# Patient Record
Sex: Female | Born: 1960 | Race: Black or African American | Hispanic: No | Marital: Married | Smoking: Never smoker
Health system: Southern US, Community
[De-identification: ages and names within clinical notes are randomized; demographics above are authoritative.]

## PROBLEM LIST (undated history)

## (undated) DIAGNOSIS — R519 Headache, unspecified: Secondary | ICD-10-CM

## (undated) DIAGNOSIS — M1990 Unspecified osteoarthritis, unspecified site: Secondary | ICD-10-CM

## (undated) DIAGNOSIS — D649 Anemia, unspecified: Secondary | ICD-10-CM

## (undated) HISTORY — PX: CHOLECYSTECTOMY: SHX55

## (undated) HISTORY — PX: BREAST BIOPSY: SHX20

---

## 2016-09-19 ENCOUNTER — Emergency Department (HOSPITAL_COMMUNITY)
Admission: EM | Admit: 2016-09-19 | Discharge: 2016-09-19 | Disposition: A | Discharge status: Home / Self Care | Payer: BLUE CROSS/BLUE SHIELD | Attending: Emergency Medicine | Admitting: Emergency Medicine

## 2016-09-19 ENCOUNTER — Encounter (HOSPITAL_COMMUNITY): Payer: Self-pay | Admitting: Emergency Medicine

## 2016-09-19 ENCOUNTER — Emergency Department (HOSPITAL_COMMUNITY): Payer: BLUE CROSS/BLUE SHIELD

## 2016-09-19 DIAGNOSIS — S99911A Unspecified injury of right ankle, initial encounter: Secondary | ICD-10-CM | POA: Diagnosis present

## 2016-09-19 DIAGNOSIS — W010XXA Fall on same level from slipping, tripping and stumbling without subsequent striking against object, initial encounter: Secondary | ICD-10-CM | POA: Diagnosis not present

## 2016-09-19 DIAGNOSIS — Y929 Unspecified place or not applicable: Secondary | ICD-10-CM | POA: Diagnosis not present

## 2016-09-19 DIAGNOSIS — S93401A Sprain of unspecified ligament of right ankle, initial encounter: Secondary | ICD-10-CM

## 2016-09-19 DIAGNOSIS — Y999 Unspecified external cause status: Secondary | ICD-10-CM | POA: Insufficient documentation

## 2016-09-19 DIAGNOSIS — Y939 Activity, unspecified: Secondary | ICD-10-CM | POA: Diagnosis not present

## 2016-09-19 MED ORDER — IBUPROFEN 200 MG PO TABS
600.0000 mg | ORAL_TABLET | Freq: Once | ORAL | Status: AC
  Administered 2016-09-19: 600 mg via ORAL
  Filled 2016-09-19: qty 3

## 2016-09-19 MED ORDER — IBUPROFEN 600 MG PO TABS: 600.0000 mg | ORAL_TABLET | Freq: Four times a day (QID) | ORAL | 0 refills | Status: AC | PRN

## 2016-09-19 NOTE — Discharge Instructions (Signed)
Please read and follow all provided instructions.  Your diagnoses today include:  1. Sprain of right ankle, unspecified ligament, initial encounter     Tests performed today include: Vital signs. See below for your results today.   Medications prescribed:  Take as prescribed   Home care instructions:  Follow any educational materials contained in this packet.  Follow-up instructions: Please follow-up with your primary care provider for further evaluation of symptoms and treatment   Return instructions:  Please return to the Emergency Department if you do not get better, if you get worse, or new symptoms OR  - Fever (temperature greater than 101.80F)  - Bleeding that does not stop with holding pressure to the area    -Severe pain (please note that you may be more sore the day after your accident)  - Chest Pain  - Difficulty breathing  - Severe nausea or vomiting  - Inability to tolerate food and liquids  - Passing out  - Skin becoming red around your wounds  - Change in mental status (confusion or lethargy)  - New numbness or weakness    Please return if you have any other emergent concerns.  Additional Information:  Your vital signs today were: BP (!) 151/73 (BP Location: Right Arm)    Pulse 74    Temp 98.2 F (36.8 C) (Oral)    Resp 18    Ht 5' 5.5" (1.664 m)    Wt 90.7 kg (200 lb)    SpO2 100%    BMI 32.78 kg/m  If your blood pressure (BP) was elevated above 135/85 this visit, please have this repeated by your doctor within one month. ---------------

## 2016-09-19 NOTE — ED Triage Notes (Signed)
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

## 2016-09-19 NOTE — ED Provider Notes (Signed)
[facility] DEPT Provider Note   CSN: [id number] Arrival date & time: 09/19/16  1900     History   Chief Complaint Chief Complaint  Patient presents with  . Fall  . Ankle Pain    HPI Lauren Villarreal is a 56 y.o. female.  HPI  56 y.o. female, presents to the Emergency Department today due to right ankle pain. Occurred this morning after buffing floors and slipping and falling. Notes pain with ambulation. Rates pain 5/10. Worse with ROM. Minimal at rest. No meds PTA. No numbness/tingling. No other symptoms noted.    History reviewed. No pertinent past medical history.  There are no active problems to display for this patient.   Past Surgical History:  Procedure Laterality Date  . CHOLECYSTECTOMY      OB History    No data available       Home Medications    Prior to Admission medications   Not on File    Family History Family History  Problem Relation Age of Onset  . Diabetes Other     Social History Social History  Substance Use Topics  . Smoking status: Never Smoker  . Smokeless tobacco: Never Used  . Alcohol use No     Allergies   Patient has no known allergies.   Review of Systems Review of Systems ROS reviewed and all are negative for acute change except as noted in the HPI.  Physical Exam Updated Vital Signs BP (!) 151/73 (BP Location: Right Arm)   Pulse 74   Temp 98.2 F (36.8 C) (Oral)   Resp 18   Ht 5' 5.5" (1.664 m)   Wt 90.7 kg (200 lb)   SpO2 100%   BMI 32.78 kg/m   Physical Exam  Constitutional: She is oriented to person, place, and time. Vital signs are normal. She appears well-developed and well-nourished.  HENT:  Head: Normocephalic and atraumatic.  Right Ear: Hearing normal.  Left Ear: Hearing normal.  Eyes: Pupils are equal, round, and reactive to light. Conjunctivae and EOM are normal.  Neck: Normal range of motion. Neck supple.  Cardiovascular: Normal rate and regular rhythm.   Pulmonary/Chest:  Effort normal.  Musculoskeletal: Normal range of motion.  Right ankle  Pain below lateral malleolus. NVI. Motor/sensation intact. ROM intact. Pain with inversion.   Neurological: She is alert and oriented to person, place, and time.  Skin: Skin is warm and dry.  Psychiatric: She has a normal mood and affect. Her speech is normal and behavior is normal. Thought content normal.  Nursing note and vitals reviewed.    ED Treatments / Results  Labs (all labs ordered are listed, but only abnormal results are displayed) Labs Reviewed - No data to display  EKG  EKG Interpretation None       Radiology Dg Ankle Complete Right  Result Date: 09/19/2016 CLINICAL DATA:  Pain after fall EXAM: RIGHT ANKLE - COMPLETE 3+ VIEW COMPARISON:  None. FINDINGS: Soft tissue swelling identified, particularly medially. Mild irregularity at the distal tip of the medial malleolus is age indeterminate but likely represents sequela of an avulsion injury. No other evidence of fracture. The ankle mortise is intact. No other acute abnormalities. IMPRESSION: Mild irregularity at the distal tip of the medial malleolus is consistent with an age indeterminate avulsion injury. No other abnormalities. Electronically Signed   By: Dorise Bullion III M.D   On: 09/19/2016 19:52    Procedures Procedures (including critical care time)  Medications Ordered in ED Medications  ibuprofen (  ADVIL,MOTRIN) tablet 600 mg (not administered)     Initial Impression / Assessment and Plan / ED Course  I have reviewed the triage vital signs and the nursing notes.  Pertinent labs & imaging results that were available during my care of the patient were reviewed by me and considered in my medical decision making (see chart for details).  Final Clinical Impressions(s) / ED Diagnoses   {I have reviewed and evaluated the relevant imaging studies.  {I have reviewed the relevant previous healthcare records.  {I obtained HPI from historian.    ED Course:  Assessment: Patient X-Ray negative for obvious fracture or dislocation. Noted age indeterminate avulsion fracture of medial malleolus. Pt advised to follow up with PCP. Patient given aso brace and cructhes while in ED, conservative therapy recommended and discussed. Patient will be discharged home & is agreeable with above plan. Returns precautions discussed. Pt appears safe for discharge.  Disposition/Plan:  DC Home Additional Verbal discharge instructions given and discussed with patient.  Pt Instructed to f/u with PCP in the next week for evaluation and treatment of symptoms. Return precautions given Pt acknowledges and agrees with plan  Supervising Physician Fredia Sorrow, MD  Final diagnoses:  Sprain of right ankle, unspecified ligament, initial encounter    New Prescriptions New Prescriptions   No medications on file     Conni Slipper 09/19/16 2128    Fredia Sorrow, MD 09/19/16 2207

## 2016-09-19 NOTE — ED Notes (Signed)
Pt ambulatory and independent at discharge.  Verbalized understanding of discharge instructions 

## 2016-09-19 NOTE — ED Notes (Signed)
Ortho at bedside.

## 2017-02-08 ENCOUNTER — Encounter (HOSPITAL_COMMUNITY): Payer: Self-pay | Admitting: *Deleted

## 2017-02-08 ENCOUNTER — Ambulatory Visit (HOSPITAL_COMMUNITY)
Admission: EM | Admit: 2017-02-08 | Discharge: 2017-02-08 | Disposition: A | Discharge status: Home / Self Care | Payer: BLUE CROSS/BLUE SHIELD | Attending: Family Medicine | Admitting: Family Medicine

## 2017-02-08 DIAGNOSIS — J019 Acute sinusitis, unspecified: Secondary | ICD-10-CM | POA: Diagnosis not present

## 2017-02-08 MED ORDER — AMOXICILLIN-POT CLAVULANATE 875-125 MG PO TABS: 1.0000 | ORAL_TABLET | Freq: Two times a day (BID) | ORAL | 0 refills | Status: AC

## 2017-02-08 MED ORDER — FLUTICASONE PROPIONATE 50 MCG/ACT NA SUSP: 1.0000 | Freq: Every day | NASAL | 2 refills | Status: AC

## 2017-02-08 NOTE — Discharge Instructions (Signed)
Push fluids to ensure adequate hydration and keep secretions thin.  Tylenol and/or ibuprofen as needed for pain or fevers.  Daily flonase. Complete course of antibiotics. May use over the counter mucinex or mucinex d as needed for symptoms as well. If symptoms worsen or do not improve in the next week to return to be seen or to follow up with your PCP.

## 2017-02-08 NOTE — ED Triage Notes (Signed)
Per pt she is having nasal congestion, head pressure,

## 2017-02-08 NOTE — ED Provider Notes (Signed)
[facility]    CSN: [id number] Arrival date & time: 02/08/17  1242     History   Chief Complaint Chief Complaint  Patient presents with  . Headache  . Facial Pain    HPI Lauren Villarreal is a 57 y.o. female.   Lauren Villarreal presents with complaints of worsening congestion, facial pressure and mild cough. Symptoms started in mid December, saw her PCP who told her it was viral. She took otc medications which mildly helped and her symptoms had somewhat improved. They never resolved however. This morning woke up clammy with increased congestion, post nasal drip, discomfort 6/10. Without sore throat, ear pain, gi/gu complaints. No known ill contacts. She is a Pharmacist, hospital. Occasional mucus production with cough. Without medical history, does not take any medications daily.   ROS per HPI.       History reviewed. No pertinent past medical history.  There are no active problems to display for this patient.   Past Surgical History:  Procedure Laterality Date  . CHOLECYSTECTOMY      OB History    No data available       Home Medications    Prior to Admission medications   Medication Sig Start Date End Date Taking? Authorizing Provider  amoxicillin-clavulanate (AUGMENTIN) 875-125 MG tablet Take 1 tablet by mouth every 12 (twelve) hours for 10 days. 02/08/17 02/18/17  Zigmund Gottron, NP  fluticasone (FLONASE) 50 MCG/ACT nasal spray Place 1 spray into both nostrils daily. 02/08/17   Zigmund Gottron, NP  ibuprofen (ADVIL,MOTRIN) 600 MG tablet Take 1 tablet (600 mg total) by mouth every 6 (six) hours as needed. 09/19/16   Shary Decamp, PA-C    Family History Family History  Problem Relation Age of Onset  . Diabetes Other     Social History Social History   Tobacco Use  . Smoking status: Never Smoker  . Smokeless tobacco: Never Used  Substance Use Topics  . Alcohol use: No  . Drug use: No     Allergies   Patient has no known allergies.   Review of  Systems Review of Systems   Physical Exam Triage Vital Signs ED Triage Vitals  Enc Vitals Group     BP 02/08/17 1429 139/83     Pulse Rate 02/08/17 1429 86     Resp --      Temp 02/08/17 1429 98.1 F (36.7 C)     Temp src --      SpO2 02/08/17 1429 98 %     Weight --      Height --      Head Circumference --      Peak Flow --      Pain Score 02/08/17 1428 6     Pain Loc --      Pain Edu? --      Excl. in [place]? --    No data found.  Updated Vital Signs BP 139/83 (BP Location: Left Arm)   Pulse 86   Temp 98.1 F (36.7 C)   SpO2 98%   Visual Acuity Right Eye Distance:   Left Eye Distance:   Bilateral Distance:    Right Eye Near:   Left Eye Near:    Bilateral Near:     Physical Exam  Constitutional: She is oriented to person, place, and time. She appears well-developed and well-nourished. No distress.  HENT:  Head: Normocephalic and atraumatic.  Right Ear: Tympanic membrane, external ear and ear canal normal.  Left Ear: Tympanic  membrane, external ear and ear canal normal.  Nose: Mucosal edema and rhinorrhea present.  Mouth/Throat: Uvula is midline, oropharynx is clear and moist and mucous membranes are normal. No tonsillar exudate.  Eyes: Conjunctivae and EOM are normal. Pupils are equal, round, and reactive to light.  Cardiovascular: Normal rate, regular rhythm and normal heart sounds.  Pulmonary/Chest: Effort normal and breath sounds normal.  Neurological: She is alert and oriented to person, place, and time.  Skin: Skin is warm and dry.     UC Treatments / Results  Labs (all labs ordered are listed, but only abnormal results are displayed) Labs Reviewed - No data to display  EKG  EKG Interpretation None       Radiology No results found.  Procedures Procedures (including critical care time)  Medications Ordered in UC Medications - No data to display   Initial Impression / Assessment and Plan / UC Course  I have reviewed the triage vital  signs and the nursing notes.  Pertinent labs & imaging results that were available during my care of the patient were reviewed by me and considered in my medical decision making (see chart for details).     Benign physical findings, non toxic in appearance. Symptoms have been persistent for approximately 1 month and have worsened. Will treat with antibiotics at this time. Daily flonase. otc treatments as needed for symptoms. Discussed with patient that this may also be a new viral illness, as without fevers. If symptoms worsen or do not improve in the next week to return to be seen or to follow up with PCP.  Patient verbalized understanding and agreeable to plan.    Final Clinical Impressions(s) / UC Diagnoses   Final diagnoses:  Acute sinusitis, recurrence not specified, unspecified location    ED Discharge Orders        Ordered    fluticasone (FLONASE) 50 MCG/ACT nasal spray  Daily     02/08/17 1502    amoxicillin-clavulanate (AUGMENTIN) 875-125 MG tablet  Every 12 hours     02/08/17 1502       Controlled Substance Prescriptions [place] Controlled Substance Registry consulted? Not Applicable   Zigmund Gottron, NP 02/08/17 1506

## 2017-02-28 ENCOUNTER — Ambulatory Visit
Admission: RE | Admit: 2017-02-28 | Discharge: 2017-02-28 | Disposition: A | Discharge status: Home / Self Care | Payer: BLUE CROSS/BLUE SHIELD | Source: Ambulatory Visit | Attending: Family Medicine | Admitting: Family Medicine

## 2017-02-28 ENCOUNTER — Other Ambulatory Visit: Payer: Self-pay | Admitting: Family Medicine

## 2017-02-28 DIAGNOSIS — R509 Fever, unspecified: Secondary | ICD-10-CM

## 2019-03-22 ENCOUNTER — Other Ambulatory Visit: Payer: Self-pay | Admitting: Family Medicine

## 2019-03-22 DIAGNOSIS — Z1231 Encounter for screening mammogram for malignant neoplasm of breast: Secondary | ICD-10-CM

## 2019-06-22 ENCOUNTER — Encounter (INDEPENDENT_AMBULATORY_CARE_PROVIDER_SITE_OTHER): Payer: Self-pay | Admitting: Otolaryngology

## 2019-06-22 ENCOUNTER — Ambulatory Visit (INDEPENDENT_AMBULATORY_CARE_PROVIDER_SITE_OTHER): Payer: BC Managed Care – PPO | Admitting: Otolaryngology

## 2019-06-22 ENCOUNTER — Other Ambulatory Visit: Payer: Self-pay

## 2019-06-22 VITALS — Temp 97.5°F

## 2019-06-22 DIAGNOSIS — H6122 Impacted cerumen, left ear: Secondary | ICD-10-CM | POA: Diagnosis not present

## 2019-06-22 NOTE — Progress Notes (Signed)
HPI: Lauren Villarreal is a 59 y.o. female who presents for evaluation of blockage of her left ear.  She has had problems with earwax in the past and is used Q-tips but this caused problems.  More recently tried to clean the ear with a Bobby pin.  She still has blockage of the left ear.  No past medical history on file. Past Surgical History:  Procedure Laterality Date   CHOLECYSTECTOMY     Social History   Socioeconomic History   Marital status: Married    Spouse name: Not on file   Number of children: Not on file   Years of education: Not on file   Highest education level: Not on file  Occupational History   Not on file  Tobacco Use   Smoking status: Never Smoker   Smokeless tobacco: Never Used  Substance and Sexual Activity   Alcohol use: No   Drug use: No   Sexual activity: Not on file  Other Topics Concern   Not on file  Social History Narrative   Not on file   Social Determinants of Health   Financial Resource Strain:    Difficulty of Paying Living Expenses:   Food Insecurity:    Worried About [place] in the Last Year:    Arboriculturist in the Last Year:   Transportation Needs:    Film/video editor (Medical):    Lack of Transportation (Non-Medical):   Physical Activity:    Days of Exercise per Week:    Minutes of Exercise per Session:   Stress:    Feeling of Stress :   Social Connections:    Frequency of Communication with Friends and Family:    Frequency of Social Gatherings with Friends and Family:    Attends Religious Services:    Active Member of Clubs or Organizations:    Attends Music therapist:    Marital Status:    Family History  Problem Relation Age of Onset   Diabetes Other    No Known Allergies Prior to Admission medications   Medication Sig Start Date End Date Taking? Authorizing Provider  fluticasone (FLONASE) 50 MCG/ACT nasal spray Place 1 spray into both nostrils daily.  02/08/17  Yes Burky, Lanelle Bal B, NP  ibuprofen (ADVIL,MOTRIN) 600 MG tablet Take 1 tablet (600 mg total) by mouth every 6 (six) hours as needed. 09/19/16  Yes Shary Decamp, PA-C     Positive ROS: Otherwise negative  All other systems have been reviewed and were otherwise negative with the exception of those mentioned in the HPI and as above.  Physical Exam: Constitutional: Alert, well-appearing, no acute distress Ears: External ears without lesions or tenderness.  Right ear canal and TM are clear.  Left ear canal is completely occluded with cerumen adjacent to the left TM.  This was cleaned with suction and hydrogen peroxide.  The TM was clear. Nasal: External nose without lesions.. Clear nasal passages Oral: Lips and gums without lesions. Tongue and palate mucosa without lesions. Posterior oropharynx clear. Neck: No palpable adenopathy or masses Respiratory: Breathing comfortably  Skin: No facial/neck lesions or rash noted.  Cerumen impaction removal  Date/Time: 06/22/2019 4:00 PM Performed by: Rozetta Nunnery, MD Authorized by: Rozetta Nunnery, MD   Consent:    Consent obtained:  Verbal   Consent given by:  Patient   Risks discussed:  Pain and bleeding Procedure details:    Location:  L ear   Procedure type: suction  Post-procedure details:    Inspection:  TM intact and canal normal   Hearing quality:  Improved   Patient tolerance of procedure:  Tolerated well, no immediate complications Comments:     Left ear canal was occluded with cerumen that was cleaned with hydroperoxide and suction.  The left TM is clear.    Assessment: Left cerumen impaction  Plan: This was cleaned in the office with resolution of her symptoms. She will follow-up as needed  Radene Journey, MD

## 2019-07-23 ENCOUNTER — Other Ambulatory Visit: Payer: Self-pay | Admitting: Family Medicine

## 2019-07-23 DIAGNOSIS — N6489 Other specified disorders of breast: Secondary | ICD-10-CM

## 2019-07-27 ENCOUNTER — Other Ambulatory Visit: Payer: Self-pay | Admitting: Family Medicine

## 2019-07-27 DIAGNOSIS — N6489 Other specified disorders of breast: Secondary | ICD-10-CM

## 2019-08-16 ENCOUNTER — Other Ambulatory Visit: Payer: BC Managed Care – PPO

## 2019-08-23 ENCOUNTER — Other Ambulatory Visit: Payer: Self-pay

## 2019-08-23 ENCOUNTER — Ambulatory Visit: Payer: BC Managed Care – PPO

## 2019-08-23 ENCOUNTER — Ambulatory Visit
Admission: RE | Admit: 2019-08-23 | Discharge: 2019-08-23 | Disposition: A | Discharge status: Home / Self Care | Payer: BC Managed Care – PPO | Source: Ambulatory Visit | Attending: Family Medicine | Admitting: Family Medicine

## 2019-08-23 DIAGNOSIS — N6489 Other specified disorders of breast: Secondary | ICD-10-CM

## 2020-05-22 ENCOUNTER — Ambulatory Visit
Admission: RE | Admit: 2020-05-22 | Discharge: 2020-05-22 | Disposition: A | Discharge status: Home / Self Care | Payer: BC Managed Care – PPO | Source: Ambulatory Visit | Attending: Family Medicine | Admitting: Family Medicine

## 2020-05-22 ENCOUNTER — Other Ambulatory Visit: Payer: Self-pay | Admitting: Family Medicine

## 2020-05-22 DIAGNOSIS — M549 Dorsalgia, unspecified: Secondary | ICD-10-CM

## 2020-08-09 ENCOUNTER — Other Ambulatory Visit: Payer: Self-pay | Admitting: Family Medicine

## 2020-08-09 DIAGNOSIS — Z1231 Encounter for screening mammogram for malignant neoplasm of breast: Secondary | ICD-10-CM

## 2020-08-23 ENCOUNTER — Other Ambulatory Visit: Payer: Self-pay

## 2020-08-23 ENCOUNTER — Ambulatory Visit
Admission: RE | Admit: 2020-08-23 | Discharge: 2020-08-23 | Disposition: A | Discharge status: Home / Self Care | Payer: BC Managed Care – PPO | Source: Ambulatory Visit | Attending: Family Medicine | Admitting: Family Medicine

## 2020-08-23 DIAGNOSIS — Z1231 Encounter for screening mammogram for malignant neoplasm of breast: Secondary | ICD-10-CM

## 2020-08-28 ENCOUNTER — Other Ambulatory Visit: Payer: Self-pay | Admitting: Family Medicine

## 2020-08-28 DIAGNOSIS — R928 Other abnormal and inconclusive findings on diagnostic imaging of breast: Secondary | ICD-10-CM

## 2020-09-02 ENCOUNTER — Other Ambulatory Visit: Payer: BC Managed Care – PPO

## 2020-09-13 ENCOUNTER — Other Ambulatory Visit: Payer: Self-pay | Admitting: Family Medicine

## 2020-09-13 ENCOUNTER — Ambulatory Visit
Admission: RE | Admit: 2020-09-13 | Discharge: 2020-09-13 | Disposition: A | Discharge status: Home / Self Care | Payer: BC Managed Care – PPO | Source: Ambulatory Visit | Attending: Family Medicine | Admitting: Family Medicine

## 2020-09-13 ENCOUNTER — Other Ambulatory Visit: Payer: Self-pay

## 2020-09-13 DIAGNOSIS — R928 Other abnormal and inconclusive findings on diagnostic imaging of breast: Secondary | ICD-10-CM

## 2020-09-13 DIAGNOSIS — N6310 Unspecified lump in the right breast, unspecified quadrant: Secondary | ICD-10-CM

## 2020-09-21 ENCOUNTER — Other Ambulatory Visit: Payer: Self-pay

## 2020-09-21 ENCOUNTER — Ambulatory Visit
Admission: RE | Admit: 2020-09-21 | Discharge: 2020-09-21 | Disposition: A | Discharge status: Home / Self Care | Payer: BC Managed Care – PPO | Source: Ambulatory Visit | Attending: Family Medicine | Admitting: Family Medicine

## 2020-09-21 ENCOUNTER — Encounter (HOSPITAL_COMMUNITY): Payer: Self-pay

## 2020-09-21 DIAGNOSIS — N6310 Unspecified lump in the right breast, unspecified quadrant: Secondary | ICD-10-CM

## 2020-10-02 ENCOUNTER — Other Ambulatory Visit: Payer: Self-pay | Admitting: Surgery

## 2020-10-02 DIAGNOSIS — N63 Unspecified lump in unspecified breast: Secondary | ICD-10-CM

## 2020-10-14 ENCOUNTER — Other Ambulatory Visit: Payer: BC Managed Care – PPO

## 2020-10-30 ENCOUNTER — Other Ambulatory Visit: Payer: Self-pay

## 2020-10-30 ENCOUNTER — Ambulatory Visit
Admission: RE | Admit: 2020-10-30 | Discharge: 2020-10-30 | Disposition: A | Discharge status: Home / Self Care | Payer: BC Managed Care – PPO | Source: Ambulatory Visit | Attending: Surgery | Admitting: Surgery

## 2020-10-30 DIAGNOSIS — N63 Unspecified lump in unspecified breast: Secondary | ICD-10-CM

## 2020-10-30 MED ORDER — GADOBUTROL 1 MMOL/ML IV SOLN
10.0000 mL | Freq: Once | INTRAVENOUS | Status: AC | PRN
  Administered 2020-10-30: 10 mL via INTRAVENOUS

## 2020-11-06 ENCOUNTER — Other Ambulatory Visit: Payer: Self-pay | Admitting: Surgery

## 2020-11-06 DIAGNOSIS — R9389 Abnormal findings on diagnostic imaging of other specified body structures: Secondary | ICD-10-CM

## 2020-11-07 ENCOUNTER — Other Ambulatory Visit: Payer: Self-pay | Admitting: Surgery

## 2020-11-07 DIAGNOSIS — R9389 Abnormal findings on diagnostic imaging of other specified body structures: Secondary | ICD-10-CM

## 2020-11-13 ENCOUNTER — Other Ambulatory Visit: Payer: BC Managed Care – PPO

## 2020-11-15 ENCOUNTER — Ambulatory Visit
Admission: RE | Admit: 2020-11-15 | Discharge: 2020-11-15 | Disposition: A | Discharge status: Home / Self Care | Payer: BC Managed Care – PPO | Source: Ambulatory Visit | Attending: Surgery | Admitting: Surgery

## 2020-11-15 ENCOUNTER — Other Ambulatory Visit: Payer: Self-pay

## 2020-11-15 ENCOUNTER — Other Ambulatory Visit (HOSPITAL_COMMUNITY): Payer: Self-pay | Admitting: Diagnostic Radiology

## 2020-11-15 DIAGNOSIS — R9389 Abnormal findings on diagnostic imaging of other specified body structures: Secondary | ICD-10-CM

## 2020-11-15 MED ORDER — GADOBUTROL 1 MMOL/ML IV SOLN
10.0000 mL | Freq: Once | INTRAVENOUS | Status: AC | PRN
  Administered 2020-11-15: 10 mL via INTRAVENOUS

## 2020-11-17 ENCOUNTER — Other Ambulatory Visit: Payer: Self-pay | Admitting: Surgery

## 2020-11-17 ENCOUNTER — Ambulatory Visit: Payer: Self-pay | Admitting: Surgery

## 2020-11-17 DIAGNOSIS — D241 Benign neoplasm of right breast: Secondary | ICD-10-CM

## 2020-11-17 DIAGNOSIS — N6489 Other specified disorders of breast: Secondary | ICD-10-CM

## 2020-11-17 NOTE — H&P (Signed)
Subjective    Chief Complaint: Breast Mass       History of Present Illness: Lauren Villarreal is a 60 y.o. female who is seen today as an office consultation at the request of Dr. Ayesha Rumpf for evaluation of Breast Mass .   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  The patient has a long history of complicated mammograms requiring previous biopsy and lumpectomy about 20 years ago in her right breast.  For the last 10 years she has had diagnostic mammograms but no other biopsies.  Most recently, she had a complicated diagnostic mammogram that showed cysts scattered through both breasts with 2 possible masses in the right breast at 9:00 located 5 cm from the nipple oriented in an anterior posterior fashion.  The posterior mass measures 18 x 10 x 5 mm in the anterior mass measures 5 x 5 x 3 mm.  The more posterior mass revealed a complex sclerosing lesion with intraductal papilloma.  The anterior mass showed an intraductal papilloma.  She is referred to [place] for surgical consultation.     Review of Systems: A complete review of systems was obtained from the patient.  I have reviewed this information and discussed as appropriate with the patient.  See HPI as well for other ROS.   Review of Systems  Constitutional: Negative.   HENT: Negative.   Eyes: Negative.   Respiratory: Negative.   Cardiovascular: Negative.   Gastrointestinal: Negative.   Genitourinary: Negative.   Musculoskeletal: Negative.   Skin: Negative.   Neurological: Negative.   Endo/Heme/Allergies: Negative.   Psychiatric/Behavioral: Negative.         Medical History: Past Medical History  History reviewed. No pertinent past medical history.        Patient Active Problem List  Diagnosis   Complex sclerosing lesion of right breast   Intraductal papilloma of breast, right      Past Surgical History       Past Surgical  History:  Procedure Laterality Date   CHOLECYSTECTOMY            Allergies      Allergies  Allergen Reactions   Clonazepam Unknown   Cyclobenzaprine Unknown              Current Outpatient Medications on File Prior to Visit  Medication Sig Dispense Refill   colestipoL (COLESTID) 1 gram tablet Take 2 g by mouth once daily        No current facility-administered medications on file prior to visit.      Family History       Family History  Problem Relation Age of Onset   High blood pressure (Hypertension) Mother     Hyperlipidemia (Elevated cholesterol) Mother     Diabetes Mother     Diabetes Father     High blood pressure (Hypertension) Father          Social History       Tobacco Use  Smoking Status Never Smoker  Smokeless Tobacco Never Used      Social History  Social History        Socioeconomic History   Marital status: Married  Tobacco Use   Smoking status: Never Smoker   Smokeless tobacco: Never Used  Scientific laboratory technician Use: Never used  Substance and Sexual Activity   Alcohol use: Not Currently   Drug use: Defer  Sexual activity: Defer        Objective:         Vitals:      Pulse: 106  Temp: 36.7 C (98 F)  SpO2: 98%  Weight: 100.3 kg (221 lb 3.2 oz)  Height: 166.4 cm (5' 5.5")    Body mass index is 36.25 kg/m.   Physical Exam    Constitutional:  WDWN in NAD, conversant, no obvious deformities; lying in bed comfortably Eyes:  Pupils equal, round; sclera anicteric; moist conjunctiva; no lid lag HENT:  Oral mucosa moist; good dentition  Neck:  No masses palpated, trachea midline; no thyromegaly Lungs:  CTA bilaterally; normal respiratory effort Breasts:  symmetric, no nipple changes; bilateral fibrocystic changes; mild ecchymosis in the right lateral breast from her biopsy ; no palpable masses or lymphadenopathy on either side CV:  Regular rate and rhythm; no murmurs; extremities well-perfused with no edema Abd:  +bowel sounds,  soft, non-tender, no palpable organomegaly; no palpable hernias Musc:  Unable to assess gait; no apparent clubbing or cyanosis in extremities Lymphatic:  No palpable cervical or axillary lymphadenopathy Skin:  Warm, dry; no sign of jaundice; left upper arm over the biceps, there is a 1 cm pigmented skin lesion.  The patient states that this has only been present for a couple years and may be enlarging slightly.  No ulceration or infection. Psychiatric - alert and oriented x 4; calm mood and affect     Labs, Imaging and Diagnostic Testing:   FINAL DIAGNOSIS Diagnosis 1. Breast, right, needle core biopsy, 9 o'clock, 5cm posterior ribbon clip - COMPLEX SCLEROSING LESION WITH INTRADUCTAL PAPILLOMA 2. Breast, right, needle core biopsy, 9 o'clock, 5cm anterior coil clip - INTRADUCTAL PAPILLOMA - NO ATYPIA OR MALIGNANCY IDENTIFIED Microscopic Comment 1. These results were called to [facility] on September 22, 2020. Thressa Sheller MD Pathologist, Electronic Signature (Case signed 09/22/2020)   CLINICAL DATA:  The patient was called back for a possible right breast mass.   EXAM: DIGITAL DIAGNOSTIC UNILATERAL RIGHT MAMMOGRAM WITH TOMOSYNTHESIS AND CAD; ULTRASOUND RIGHT BREAST LIMITED   TECHNIQUE: Right digital diagnostic mammography and breast tomosynthesis was performed. The images were evaluated with computer-aided detection.; Targeted ultrasound examination of the right breast was performed   COMPARISON:  Previous exam(s).   ACR Breast Density Category b: There are scattered areas of fibroglandular density.   FINDINGS: The possible mass on the right cc view resolves on additional imaging. The mass in the posterior right breast on the MLO view persists on additional imaging. There appear to be multiple masses in the right breast mammographically on the MLO view.   On physical exam, no suspicious lumps are identified.   Targeted ultrasound is performed, showing  multiple simple and complicated cysts. The mass seen mammographically is noted at 9 o'clock, 5 cm from the nipple measuring 18 by 10 x 5 mm. While this mass demonstrates anechoic components, there are also hypoechoic components. This mass is a complex cystic mass. Anterior to this mass is another at 9 o'clock, 5 cm from the nipple measuring 5 by 5 x 3 mm. This could be a debris-filled cyst versus a solid mass. Multiple similar appearing masses are seen throughout the adjacent right breast. No axillary adenopathy.   IMPRESSION: Multiple masses are seen in the lateral right breast. The mammographically identified mass has cystic components and is likely a complicated cyst. Solid components are not excluded. Anterior to this mass is another mass which could represent a  debris-filled cyst versus a solid mass. Numerous other similar appearing masses are seen in this region.   RECOMMENDATION: Recommend ultrasound-guided biopsy of the 2 masses at 9 o'clock, 5 cm from the nipple. If these biopsies are benign, recommend six-month follow-up mammography and ultrasound to ensure stability of the other surrounding masses. If either biopsy results in the need for surgery, recommend breast MRI.   I have discussed the findings and recommendations with the patient. If applicable, a reminder letter will be sent to the patient regarding the next appointment.   BI-RADS CATEGORY  4: Suspicious.     Electronically Signed   By: Dorise Bullion III M.D.   On: 09/13/2020 17:13     Assessment and Plan:  Diagnoses and all orders for this visit:   Complex sclerosing lesion of right breast   Intraductal papilloma of breast, right   Arm skin lesion, left     We will first obtain bilateral breast MRI due to the complex nature of her mammograms.  This will help [place] rule out any other suspicious findings.  If the MRI is otherwise normal, we will recommend right radioactive seed localized lumpectomy x2.   This can likely be performed through a single incision as the 2 masses are located anterior posterior.   I went ahead and discussed the procedure with the patient today with her husband on the telephone.  Right radioactive seed localized lumpectomy x2.  Excision of left upper extremity skin lesion.  The surgical procedure has been discussed with the patient.  Potential risks, benefits, alternative treatments, and expected outcomes have been explained.  All of the patient's questions at this time have been answered.  The likelihood of reaching the patient's treatment goal is good.  The patient understand the proposed surgical procedure and wishes to proceed.   MRI revealed only the presence of an intraductal papilloma in the right breast     Degan Hanser Jearld Adjutant, MD  10/02/2020 10:45 AM

## 2020-11-17 NOTE — H&P (View-Only) (Signed)
Subjective    Chief Complaint: Breast Mass       History of Present Illness: Lauren Villarreal is a 60 y.o. female who is seen today as an office consultation at the request of Dr. Ayesha Rumpf for evaluation of Breast Mass .   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  The patient has a long history of complicated mammograms requiring previous biopsy and lumpectomy about 20 years ago in her right breast.  For the last 10 years she has had diagnostic mammograms but no other biopsies.  Most recently, she had a complicated diagnostic mammogram that showed cysts scattered through both breasts with 2 possible masses in the right breast at 9:00 located 5 cm from the nipple oriented in an anterior posterior fashion.  The posterior mass measures 18 x 10 x 5 mm in the anterior mass measures 5 x 5 x 3 mm.  The more posterior mass revealed a complex sclerosing lesion with intraductal papilloma.  The anterior mass showed an intraductal papilloma.  She is referred to [place] for surgical consultation.     Review of Systems: A complete review of systems was obtained from the patient.  I have reviewed this information and discussed as appropriate with the patient.  See HPI as well for other ROS.   Review of Systems  Constitutional: Negative.   HENT: Negative.   Eyes: Negative.   Respiratory: Negative.   Cardiovascular: Negative.   Gastrointestinal: Negative.   Genitourinary: Negative.   Musculoskeletal: Negative.   Skin: Negative.   Neurological: Negative.   Endo/Heme/Allergies: Negative.   Psychiatric/Behavioral: Negative.         Medical History: Past Medical History  History reviewed. No pertinent past medical history.        Patient Active Problem List  Diagnosis   Complex sclerosing lesion of right breast   Intraductal papilloma of breast, right      Past Surgical History       Past Surgical  History:  Procedure Laterality Date   CHOLECYSTECTOMY            Allergies      Allergies  Allergen Reactions   Clonazepam Unknown   Cyclobenzaprine Unknown              Current Outpatient Medications on File Prior to Visit  Medication Sig Dispense Refill   colestipoL (COLESTID) 1 gram tablet Take 2 g by mouth once daily        No current facility-administered medications on file prior to visit.      Family History       Family History  Problem Relation Age of Onset   High blood pressure (Hypertension) Mother     Hyperlipidemia (Elevated cholesterol) Mother     Diabetes Mother     Diabetes Father     High blood pressure (Hypertension) Father          Social History       Tobacco Use  Smoking Status Never Smoker  Smokeless Tobacco Never Used      Social History  Social History        Socioeconomic History   Marital status: Married  Tobacco Use   Smoking status: Never Smoker   Smokeless tobacco: Never Used  Scientific laboratory technician Use: Never used  Substance and Sexual Activity   Alcohol use: Not Currently   Drug use: Defer  Sexual activity: Defer        Objective:         Vitals:      Pulse: 106  Temp: 36.7 C (98 F)  SpO2: 98%  Weight: 100.3 kg (221 lb 3.2 oz)  Height: 166.4 cm (5' 5.5")    Body mass index is 36.25 kg/m.   Physical Exam    Constitutional:  WDWN in NAD, conversant, no obvious deformities; lying in bed comfortably Eyes:  Pupils equal, round; sclera anicteric; moist conjunctiva; no lid lag HENT:  Oral mucosa moist; good dentition  Neck:  No masses palpated, trachea midline; no thyromegaly Lungs:  CTA bilaterally; normal respiratory effort Breasts:  symmetric, no nipple changes; bilateral fibrocystic changes; mild ecchymosis in the right lateral breast from her biopsy ; no palpable masses or lymphadenopathy on either side CV:  Regular rate and rhythm; no murmurs; extremities well-perfused with no edema Abd:  +bowel sounds,  soft, non-tender, no palpable organomegaly; no palpable hernias Musc:  Unable to assess gait; no apparent clubbing or cyanosis in extremities Lymphatic:  No palpable cervical or axillary lymphadenopathy Skin:  Warm, dry; no sign of jaundice; left upper arm over the biceps, there is a 1 cm pigmented skin lesion.  The patient states that this has only been present for a couple years and may be enlarging slightly.  No ulceration or infection. Psychiatric - alert and oriented x 4; calm mood and affect     Labs, Imaging and Diagnostic Testing:   FINAL DIAGNOSIS Diagnosis 1. Breast, right, needle core biopsy, 9 o'clock, 5cm posterior ribbon clip - COMPLEX SCLEROSING LESION WITH INTRADUCTAL PAPILLOMA 2. Breast, right, needle core biopsy, 9 o'clock, 5cm anterior coil clip - INTRADUCTAL PAPILLOMA - NO ATYPIA OR MALIGNANCY IDENTIFIED Microscopic Comment 1. These results were called to [facility] on September 22, 2020. Thressa Sheller MD Pathologist, Electronic Signature (Case signed 09/22/2020)   CLINICAL DATA:  The patient was called back for a possible right breast mass.   EXAM: DIGITAL DIAGNOSTIC UNILATERAL RIGHT MAMMOGRAM WITH TOMOSYNTHESIS AND CAD; ULTRASOUND RIGHT BREAST LIMITED   TECHNIQUE: Right digital diagnostic mammography and breast tomosynthesis was performed. The images were evaluated with computer-aided detection.; Targeted ultrasound examination of the right breast was performed   COMPARISON:  Previous exam(s).   ACR Breast Density Category b: There are scattered areas of fibroglandular density.   FINDINGS: The possible mass on the right cc view resolves on additional imaging. The mass in the posterior right breast on the MLO view persists on additional imaging. There appear to be multiple masses in the right breast mammographically on the MLO view.   On physical exam, no suspicious lumps are identified.   Targeted ultrasound is performed, showing  multiple simple and complicated cysts. The mass seen mammographically is noted at 9 o'clock, 5 cm from the nipple measuring 18 by 10 x 5 mm. While this mass demonstrates anechoic components, there are also hypoechoic components. This mass is a complex cystic mass. Anterior to this mass is another at 9 o'clock, 5 cm from the nipple measuring 5 by 5 x 3 mm. This could be a debris-filled cyst versus a solid mass. Multiple similar appearing masses are seen throughout the adjacent right breast. No axillary adenopathy.   IMPRESSION: Multiple masses are seen in the lateral right breast. The mammographically identified mass has cystic components and is likely a complicated cyst. Solid components are not excluded. Anterior to this mass is another mass which could represent a  debris-filled cyst versus a solid mass. Numerous other similar appearing masses are seen in this region.   RECOMMENDATION: Recommend ultrasound-guided biopsy of the 2 masses at 9 o'clock, 5 cm from the nipple. If these biopsies are benign, recommend six-month follow-up mammography and ultrasound to ensure stability of the other surrounding masses. If either biopsy results in the need for surgery, recommend breast MRI.   I have discussed the findings and recommendations with the patient. If applicable, a reminder letter will be sent to the patient regarding the next appointment.   BI-RADS CATEGORY  4: Suspicious.     Electronically Signed   By: Dorise Bullion III M.D.   On: 09/13/2020 17:13     Assessment and Plan:  Diagnoses and all orders for this visit:   Complex sclerosing lesion of right breast   Intraductal papilloma of breast, right   Arm skin lesion, left     We will first obtain bilateral breast MRI due to the complex nature of her mammograms.  This will help [place] rule out any other suspicious findings.  If the MRI is otherwise normal, we will recommend right radioactive seed localized lumpectomy x2.   This can likely be performed through a single incision as the 2 masses are located anterior posterior.   I went ahead and discussed the procedure with the patient today with her husband on the telephone.  Right radioactive seed localized lumpectomy x2.  Excision of left upper extremity skin lesion.  The surgical procedure has been discussed with the patient.  Potential risks, benefits, alternative treatments, and expected outcomes have been explained.  All of the patient's questions at this time have been answered.  The likelihood of reaching the patient's treatment goal is good.  The patient understand the proposed surgical procedure and wishes to proceed.   MRI revealed only the presence of an intraductal papilloma in the right breast     Nyla Creason Jearld Adjutant, MD  10/02/2020 10:45 AM

## 2020-11-22 NOTE — Pre-Procedure Instructions (Signed)
Surgical Instructions    Your procedure is scheduled on Wednesday 11/29/20.   Report to [facility] Main Entrance "A" at 06:30 A.M., then check in with the Admitting office.  Call this number if you have problems the morning of surgery:  [phone]   If you have any questions prior to your surgery date call [phone]: Open Monday-Friday 8am-4pm    Remember:  Do not eat after midnight the night before your surgery  You may drink clear liquids until 05:30 A.M. the morning of your surgery.   Clear liquids allowed are: Water, Non-Citrus Juices (without pulp), Carbonated Beverages, Clear Tea, Black Coffee ONLY (NO MILK, CREAM OR POWDERED CREAMER of any kind), and Gatorade    Take these medicines the morning of surgery with A SIP OF WATER   colestipol (COLESTID)  acetaminophen (TYLENOL)- If needed    As of today, STOP taking any Aspirin (unless otherwise instructed by your surgeon) Aleve, Naproxen, Ibuprofen, Motrin, Advil, Goody's, BC's, all herbal medications, fish oil, and all vitamins.     After your COVID test   You are not required to quarantine however you are required to wear a well-fitting mask when you are out and around people not in your household.  If your mask becomes wet or soiled, replace with a new one.  Wash your hands often with soap and water for 20 seconds or clean your hands with an alcohol-based hand sanitizer that contains at least 60% alcohol.  Do not share personal items.  Notify your provider: if you are in close contact with someone who has COVID  or if you develop a fever of 100.4 or greater, sneezing, cough, sore throat, shortness of breath or body aches.             Do not wear jewelry or makeup Do not wear lotions, powders, perfumes/colognes, or deodorant. Do not shave 48 hours prior to surgery.  Men may shave face and neck. Do not bring valuables to the hospital. DO Not wear nail polish, gel polish, artificial nails, or any other type of  covering on natural nails including finger and toenails. If patients have artificial nails, gel coating, etc. that need to be removed by a nail salon, please have this removed prior to surgery or surgery may need to be canceled/delayed if the surgeon/ anesthesia feels like the patient is unable to be adequately monitored.             [facility] is not responsible for any belongings or valuables.  Do NOT Smoke (Tobacco/Vaping)  24 hours prior to your procedure  If you use a CPAP at night, you may bring your mask for your overnight stay.   Contacts, glasses, hearing aids, dentures or partials may not be worn into surgery, please bring cases for these belongings   For patients admitted to the hospital, discharge time will be determined by your treatment team.   Patients discharged the day of surgery will not be allowed to drive home, and someone needs to stay with them for 24 hours.  NO VISITORS WILL BE ALLOWED IN PRE-OP WHERE PATIENTS ARE PREPPED FOR SURGERY.  ONLY 1 SUPPORT PERSON MAY BE PRESENT IN THE WAITING ROOM WHILE YOU ARE IN SURGERY.  IF YOU ARE TO BE ADMITTED, ONCE YOU ARE IN YOUR ROOM YOU WILL BE ALLOWED TWO (2) VISITORS. 1 (ONE) VISITOR MAY STAY OVERNIGHT BUT MUST ARRIVE TO THE ROOM BY 8pm.  Minor children may have two parents present. Special consideration for safety  and communication needs will be reviewed on a case by case basis.  Special instructions:    Oral Hygiene is also important to reduce your risk of infection.  Remember - BRUSH YOUR TEETH THE MORNING OF SURGERY WITH YOUR REGULAR TOOTHPASTE   [facility]- Preparing For Surgery  Before surgery, you can play an important role. Because skin is not sterile, your skin needs to be as free of germs as possible. You can reduce the number of germs on your skin by washing with CHG (chlorahexidine gluconate) Soap before surgery.  CHG is an antiseptic cleaner which kills germs and bonds with the skin to continue killing germs even  after washing.     Please do not use if you have an allergy to CHG or antibacterial soaps. If your skin becomes reddened/irritated stop using the CHG.  Do not shave (including legs and underarms) for at least 48 hours prior to first CHG shower. It is OK to shave your face.  Please follow these instructions carefully.     Shower the NIGHT BEFORE SURGERY and the MORNING OF SURGERY with CHG Soap.   If you chose to wash your hair, wash your hair first as usual with your normal shampoo. After you shampoo, rinse your hair and body thoroughly to remove the shampoo.  Then [employer] and genitals (private parts) with your normal soap and rinse thoroughly to remove soap.  After that Use CHG Soap as you would any other liquid soap. You can apply CHG directly to the skin and wash gently with a scrungie or a clean washcloth.   Apply the CHG Soap to your body ONLY FROM THE NECK DOWN.  Do not use on open wounds or open sores. Avoid contact with your eyes, ears, mouth and genitals (private parts). Wash Face and genitals (private parts)  with your normal soap.   Wash thoroughly, paying special attention to the area where your surgery will be performed.  Thoroughly rinse your body with warm water from the neck down.  DO NOT shower/wash with your normal soap after using and rinsing off the CHG Soap.  Pat yourself dry with a CLEAN TOWEL.  Wear CLEAN PAJAMAS to bed the night before surgery  Place CLEAN SHEETS on your bed the night before your surgery  DO NOT SLEEP WITH PETS.   Day of Surgery:  Take a shower with CHG soap. Wear Clean/Comfortable clothing the morning of surgery Do not apply any deodorants/lotions.   Remember to brush your teeth WITH YOUR REGULAR TOOTHPASTE.   Please read over the following fact sheets that you were given.

## 2020-11-23 ENCOUNTER — Inpatient Hospital Stay (HOSPITAL_COMMUNITY)
Admission: RE | Admit: 2020-11-23 | Discharge: 2020-11-23 | Disposition: A | Discharge status: Home / Self Care | Payer: BC Managed Care – PPO | Source: Ambulatory Visit

## 2020-11-24 ENCOUNTER — Encounter (HOSPITAL_COMMUNITY): Payer: Self-pay

## 2020-11-24 ENCOUNTER — Other Ambulatory Visit: Payer: Self-pay

## 2020-11-24 ENCOUNTER — Encounter (HOSPITAL_COMMUNITY)
Admission: RE | Admit: 2020-11-24 | Discharge: 2020-11-24 | Disposition: A | Discharge status: Home / Self Care | Payer: BC Managed Care – PPO | Source: Ambulatory Visit | Attending: Surgery | Admitting: Surgery

## 2020-11-24 VITALS — BP 148/95 | HR 70 | Temp 98.1°F | Resp 18 | Ht 65.5 in | Wt 222.6 lb

## 2020-11-24 DIAGNOSIS — Z01818 Encounter for other preprocedural examination: Secondary | ICD-10-CM

## 2020-11-24 DIAGNOSIS — Z01812 Encounter for preprocedural laboratory examination: Secondary | ICD-10-CM | POA: Insufficient documentation

## 2020-11-24 HISTORY — DX: Unspecified osteoarthritis, unspecified site: M19.90

## 2020-11-24 HISTORY — DX: Anemia, unspecified: D64.9

## 2020-11-24 HISTORY — DX: Headache, unspecified: R51.9

## 2020-11-24 LAB — CBC
HCT: 40.1 % (ref 36.0–46.0)
Hemoglobin: 13.1 g/dL (ref 12.0–15.0)
MCH: 27.9 pg (ref 26.0–34.0)
MCHC: 32.7 g/dL (ref 30.0–36.0)
MCV: 85.5 fL (ref 80.0–100.0)
Platelets: 287 10*3/uL (ref 150–400)
RBC: 4.69 MIL/uL (ref 3.87–5.11)
RDW: 14.2 % (ref 11.5–15.5)
WBC: 6.5 10*3/uL (ref 4.0–10.5)
nRBC: 0 % (ref 0.0–0.2)

## 2020-11-24 LAB — GLUCOSE, CAPILLARY: Glucose-Capillary: 149 mg/dL — ABNORMAL HIGH (ref 70–99)

## 2020-11-24 NOTE — Progress Notes (Signed)
PCP - Lin Landsman Cardiologist - denies  Chest x-ray - n/a EKG - n/a   ERAS Protcol - yes, no drink ordered or given   COVID TEST- n/a (ambulatory)   Anesthesia review: n/a  Patient denies shortness of breath, fever, cough and chest pain at PAT appointment   All instructions explained to the patient, with a verbal understanding of the material. Patient agrees to go over the instructions while at home for a better understanding. Patient also instructed to self quarantine after being tested for COVID-19. The opportunity to ask questions was provided.

## 2020-11-24 NOTE — Pre-Procedure Instructions (Signed)
Surgical Instructions    Your procedure is scheduled on Wednesday 11/29/20.   Report to [facility] Main Entrance "A" at 06:30 A.M., then check in with the Admitting office.  Call this number if you have problems the morning of surgery:  [phone]   If you have any questions prior to your surgery date call [phone]: Open Monday-Friday 8am-4pm    Remember:  Do not eat after midnight the night before your surgery  You may drink clear liquids until 05:30 A.M. the morning of your surgery.   Clear liquids allowed are: Water, Non-Citrus Juices (without pulp), Carbonated Beverages, Clear Tea, Black Coffee ONLY (NO MILK, CREAM OR POWDERED CREAMER of any kind), and Gatorade    Take these medicines the morning of surgery with A SIP OF WATER   colestipol (COLESTID)  acetaminophen (TYLENOL)- If needed   As of today, STOP taking any Aspirin (unless otherwise instructed by your surgeon) Aleve, Naproxen, Ibuprofen, Motrin, Advil, Goody's, BC's, all herbal medications, fish oil, and all vitamins.   DAY OF SURGERY         Do not wear jewelry, makeup, or nail polish Do not wear lotions, powders, perfumes, or deodorant. Do not shave 48 hours prior to surgery.   Do not bring valuables to the hospital.             [facility] is not responsible for any belongings or valuables.  Do NOT Smoke (Tobacco/Vaping)  24 hours prior to your procedure  If you use a CPAP at night, you may bring your mask for your overnight stay.   Contacts, glasses, hearing aids, dentures or partials may not be worn into surgery, please bring cases for these belongings   For patients admitted to the hospital, discharge time will be determined by your treatment team.   Patients discharged the day of surgery will not be allowed to drive home, and someone needs to stay with them for 24 hours.  NO VISITORS WILL BE ALLOWED IN PRE-OP WHERE PATIENTS ARE PREPPED FOR SURGERY.  ONLY 1 SUPPORT PERSON MAY BE PRESENT IN THE  WAITING ROOM WHILE YOU ARE IN SURGERY.  IF YOU ARE TO BE ADMITTED, ONCE YOU ARE IN YOUR ROOM YOU WILL BE ALLOWED TWO (2) VISITORS. 1 (ONE) VISITOR MAY STAY OVERNIGHT BUT MUST ARRIVE TO THE ROOM BY 8pm.  Minor children may have two parents present. Special consideration for safety and communication needs will be reviewed on a case by case basis.  Special instructions:    Oral Hygiene is also important to reduce your risk of infection.  Remember - BRUSH YOUR TEETH THE MORNING OF SURGERY WITH YOUR REGULAR TOOTHPASTE   [place]- Preparing For Surgery  Before surgery, you can play an important role. Because skin is not sterile, your skin needs to be as free of germs as possible. You can reduce the number of germs on your skin by washing with CHG (chlorahexidine gluconate) Soap before surgery.  CHG is an antiseptic cleaner which kills germs and bonds with the skin to continue killing germs even after washing.     Please do not use if you have an allergy to CHG or antibacterial soaps. If your skin becomes reddened/irritated stop using the CHG.  Do not shave (including legs and underarms) for at least 48 hours prior to first CHG shower. It is OK to shave your face.  Please follow these instructions carefully.     Shower the NIGHT BEFORE SURGERY and the MORNING OF SURGERY with  CHG Soap.   If you chose to wash your hair, wash your hair first as usual with your normal shampoo. After you shampoo, rinse your hair and body thoroughly to remove the shampoo.  Then [employer] and genitals (private parts) with your normal soap and rinse thoroughly to remove soap.  After that Use CHG Soap as you would any other liquid soap. You can apply CHG directly to the skin and wash gently with a scrungie or a clean washcloth.   Apply the CHG Soap to your body ONLY FROM THE NECK DOWN.  Do not use on open wounds or open sores. Avoid contact with your eyes, ears, mouth and genitals (private parts). Wash Face and genitals  (private parts)  with your normal soap.   Wash thoroughly, paying special attention to the area where your surgery will be performed.  Thoroughly rinse your body with warm water from the neck down.  DO NOT shower/wash with your normal soap after using and rinsing off the CHG Soap.  Pat yourself dry with a CLEAN TOWEL.  Wear CLEAN PAJAMAS to bed the night before surgery  Place CLEAN SHEETS on your bed the night before your surgery  DO NOT SLEEP WITH PETS.   Day of Surgery:  Take a shower with CHG soap. Wear Clean/Comfortable clothing the morning of surgery Do not apply any deodorants/lotions.   Remember to brush your teeth WITH YOUR REGULAR TOOTHPASTE.   Please read over the following fact sheets that you were given.

## 2020-11-27 ENCOUNTER — Ambulatory Visit: Payer: Self-pay | Admitting: Surgery

## 2020-11-27 DIAGNOSIS — D241 Benign neoplasm of right breast: Secondary | ICD-10-CM

## 2020-11-28 ENCOUNTER — Other Ambulatory Visit: Payer: Self-pay

## 2020-11-28 ENCOUNTER — Other Ambulatory Visit: Payer: Self-pay | Admitting: Surgery

## 2020-11-28 ENCOUNTER — Ambulatory Visit
Admission: RE | Admit: 2020-11-28 | Discharge: 2020-11-28 | Disposition: A | Discharge status: Home / Self Care | Payer: BC Managed Care – PPO | Source: Ambulatory Visit | Attending: Surgery | Admitting: Surgery

## 2020-11-28 DIAGNOSIS — N6489 Other specified disorders of breast: Secondary | ICD-10-CM

## 2020-11-28 DIAGNOSIS — D241 Benign neoplasm of right breast: Secondary | ICD-10-CM

## 2020-11-29 ENCOUNTER — Ambulatory Visit
Admit: 2020-11-29 | Discharge: 2020-11-29 | Disposition: A | Discharge status: Home / Self Care | Payer: BC Managed Care – PPO | Attending: Surgery | Admitting: Surgery

## 2020-11-29 ENCOUNTER — Encounter (HOSPITAL_COMMUNITY): Payer: Self-pay | Admitting: Surgery

## 2020-11-29 ENCOUNTER — Other Ambulatory Visit: Payer: Self-pay

## 2020-11-29 ENCOUNTER — Ambulatory Visit
Admission: RE | Admit: 2020-11-29 | Discharge: 2020-11-29 | Disposition: A | Discharge status: Home / Self Care | Payer: BC Managed Care – PPO | Source: Ambulatory Visit | Attending: Surgery | Admitting: Surgery

## 2020-11-29 ENCOUNTER — Ambulatory Visit (HOSPITAL_COMMUNITY): Payer: BC Managed Care – PPO | Admitting: Certified Registered"

## 2020-11-29 ENCOUNTER — Ambulatory Visit (HOSPITAL_COMMUNITY)
Admission: RE | Admit: 2020-11-29 | Discharge: 2020-11-29 | Disposition: A | Discharge status: Home / Self Care | Payer: BC Managed Care – PPO | Attending: Surgery | Admitting: Surgery

## 2020-11-29 ENCOUNTER — Encounter (HOSPITAL_COMMUNITY)
Admission: RE | Disposition: A | Discharge status: Home / Self Care | Payer: Self-pay | Source: Home / Self Care | Attending: Surgery

## 2020-11-29 DIAGNOSIS — N6489 Other specified disorders of breast: Secondary | ICD-10-CM | POA: Insufficient documentation

## 2020-11-29 DIAGNOSIS — N6011 Diffuse cystic mastopathy of right breast: Secondary | ICD-10-CM | POA: Diagnosis not present

## 2020-11-29 DIAGNOSIS — D241 Benign neoplasm of right breast: Secondary | ICD-10-CM | POA: Insufficient documentation

## 2020-11-29 HISTORY — PX: BREAST LUMPECTOMY WITH RADIOACTIVE SEED LOCALIZATION: SHX6424

## 2020-11-29 HISTORY — PX: LESION REMOVAL: SHX5196

## 2020-11-29 LAB — GLUCOSE, CAPILLARY: Glucose-Capillary: 121 mg/dL — ABNORMAL HIGH (ref 70–99)

## 2020-11-29 SURGERY — BREAST LUMPECTOMY WITH RADIOACTIVE SEED LOCALIZATION
Anesthesia: General | Site: Breast | Laterality: Right

## 2020-11-29 MED ORDER — FENTANYL CITRATE (PF) 100 MCG/2ML IJ SOLN: 25.0000 ug | INTRAMUSCULAR | Status: DC | PRN

## 2020-11-29 MED ORDER — METHYLENE BLUE 0.5 % INJ SOLN
INTRAVENOUS | Status: AC
  Filled 2020-11-29: qty 10

## 2020-11-29 MED ORDER — ACETAMINOPHEN 160 MG/5ML PO SOLN: 1000.0000 mg | Freq: Once | ORAL | Status: DC | PRN

## 2020-11-29 MED ORDER — OXYCODONE HCL 5 MG/5ML PO SOLN: 5.0000 mg | Freq: Once | ORAL | Status: AC | PRN

## 2020-11-29 MED ORDER — GLYCOPYRROLATE PF 0.2 MG/ML IJ SOSY
PREFILLED_SYRINGE | INTRAMUSCULAR | Status: DC | PRN
  Administered 2020-11-29: .2 mg via INTRAVENOUS

## 2020-11-29 MED ORDER — LACTATED RINGERS IV SOLN: INTRAVENOUS | Status: DC

## 2020-11-29 MED ORDER — ONDANSETRON HCL 4 MG/2ML IJ SOLN
INTRAMUSCULAR | Status: DC | PRN
  Administered 2020-11-29: 4 mg via INTRAVENOUS

## 2020-11-29 MED ORDER — BUPIVACAINE-EPINEPHRINE 0.25% -1:200000 IJ SOLN
INTRAMUSCULAR | Status: DC | PRN
  Administered 2020-11-29: 14 mL

## 2020-11-29 MED ORDER — BUPIVACAINE-EPINEPHRINE (PF) 0.25% -1:200000 IJ SOLN
INTRAMUSCULAR | Status: AC
  Filled 2020-11-29: qty 30

## 2020-11-29 MED ORDER — CEFAZOLIN SODIUM-DEXTROSE 2-4 GM/100ML-% IV SOLN
2.0000 g | INTRAVENOUS | Status: AC
  Administered 2020-11-29: 2 g via INTRAVENOUS
  Filled 2020-11-29: qty 100

## 2020-11-29 MED ORDER — CHLORHEXIDINE GLUCONATE 0.12 % MT SOLN
15.0000 mL | Freq: Once | OROMUCOSAL | Status: AC
  Administered 2020-11-29: 15 mL via OROMUCOSAL
  Filled 2020-11-29: qty 15

## 2020-11-29 MED ORDER — KETOROLAC TROMETHAMINE 30 MG/ML IJ SOLN
INTRAMUSCULAR | Status: DC | PRN
  Administered 2020-11-29: 30 mg via INTRAVENOUS

## 2020-11-29 MED ORDER — 0.9 % SODIUM CHLORIDE (POUR BTL) OPTIME
TOPICAL | Status: DC | PRN
  Administered 2020-11-29: 1000 mL

## 2020-11-29 MED ORDER — LIDOCAINE 2% (20 MG/ML) 5 ML SYRINGE
INTRAMUSCULAR | Status: DC | PRN
  Administered 2020-11-29: 100 mg via INTRAVENOUS

## 2020-11-29 MED ORDER — SODIUM CHLORIDE (PF) 0.9 % IJ SOLN
INTRAMUSCULAR | Status: AC
  Filled 2020-11-29: qty 10

## 2020-11-29 MED ORDER — PROPOFOL 500 MG/50ML IV EMUL
INTRAVENOUS | Status: DC | PRN
  Administered 2020-11-29: 200 ug/kg/min via INTRAVENOUS

## 2020-11-29 MED ORDER — PROPOFOL 10 MG/ML IV BOLUS
INTRAVENOUS | Status: AC
  Filled 2020-11-29: qty 20

## 2020-11-29 MED ORDER — ACETAMINOPHEN 500 MG PO TABS
1000.0000 mg | ORAL_TABLET | ORAL | Status: AC
  Administered 2020-11-29: 1000 mg via ORAL
  Filled 2020-11-29: qty 2

## 2020-11-29 MED ORDER — MIDAZOLAM HCL 2 MG/2ML IJ SOLN
INTRAMUSCULAR | Status: AC
  Filled 2020-11-29: qty 2

## 2020-11-29 MED ORDER — FENTANYL CITRATE (PF) 250 MCG/5ML IJ SOLN
INTRAMUSCULAR | Status: DC | PRN
  Administered 2020-11-29: 100 ug via INTRAVENOUS

## 2020-11-29 MED ORDER — PROPOFOL 10 MG/ML IV BOLUS
INTRAVENOUS | Status: DC | PRN
  Administered 2020-11-29: 80 mg via INTRAVENOUS

## 2020-11-29 MED ORDER — DEXAMETHASONE SODIUM PHOSPHATE 10 MG/ML IJ SOLN
INTRAMUSCULAR | Status: DC | PRN
  Administered 2020-11-29: 10 mg via INTRAVENOUS

## 2020-11-29 MED ORDER — ORAL CARE MOUTH RINSE: 15.0000 mL | Freq: Once | OROMUCOSAL | Status: AC

## 2020-11-29 MED ORDER — OXYCODONE HCL 5 MG PO TABS
5.0000 mg | ORAL_TABLET | Freq: Once | ORAL | Status: AC | PRN
  Administered 2020-11-29: 5 mg via ORAL

## 2020-11-29 MED ORDER — TRAMADOL HCL 50 MG PO TABS: 50.0000 mg | ORAL_TABLET | Freq: Four times a day (QID) | ORAL | 0 refills | Status: AC | PRN

## 2020-11-29 MED ORDER — ACETAMINOPHEN 500 MG PO TABS: 1000.0000 mg | ORAL_TABLET | Freq: Once | ORAL | Status: DC | PRN

## 2020-11-29 MED ORDER — CHLORHEXIDINE GLUCONATE CLOTH 2 % EX PADS: 6.0000 | MEDICATED_PAD | Freq: Once | CUTANEOUS | Status: DC

## 2020-11-29 MED ORDER — ACETAMINOPHEN 10 MG/ML IV SOLN: 1000.0000 mg | Freq: Once | INTRAVENOUS | Status: DC | PRN

## 2020-11-29 MED ORDER — OXYCODONE HCL 5 MG PO TABS
ORAL_TABLET | ORAL | Status: AC
  Filled 2020-11-29: qty 1

## 2020-11-29 MED ORDER — LACTATED RINGERS IV SOLN: INTRAVENOUS | Status: DC | PRN

## 2020-11-29 MED ORDER — MIDAZOLAM HCL 2 MG/2ML IJ SOLN
INTRAMUSCULAR | Status: DC | PRN
  Administered 2020-11-29: 2 mg via INTRAVENOUS

## 2020-11-29 MED ORDER — FENTANYL CITRATE (PF) 250 MCG/5ML IJ SOLN
INTRAMUSCULAR | Status: AC
  Filled 2020-11-29: qty 5

## 2020-11-29 SURGICAL SUPPLY — 46 items
APPLIER CLIP 9.375 MED OPEN (MISCELLANEOUS)
BAG COUNTER SPONGE SURGICOUNT (BAG) ×3 IMPLANT
BAG SURGICOUNT SPONGE COUNTING (BAG) ×1
BENZOIN TINCTURE PRP APPL 2/3 (GAUZE/BANDAGES/DRESSINGS) ×8 IMPLANT
BINDER BREAST LRG (GAUZE/BANDAGES/DRESSINGS) IMPLANT
BINDER BREAST XLRG (GAUZE/BANDAGES/DRESSINGS) IMPLANT
BINDER BREAST XXLRG (GAUZE/BANDAGES/DRESSINGS) ×4 IMPLANT
CANISTER SUCT 3000ML PPV (MISCELLANEOUS) ×4 IMPLANT
CHLORAPREP W/TINT 26 (MISCELLANEOUS) ×4 IMPLANT
CLIP APPLIE 9.375 MED OPEN (MISCELLANEOUS) IMPLANT
CLOSURE WOUND 1/2 X4 (GAUZE/BANDAGES/DRESSINGS) ×2
COVER PROBE W GEL 5X96 (DRAPES) ×4 IMPLANT
COVER SURGICAL LIGHT HANDLE (MISCELLANEOUS) ×4 IMPLANT
DEVICE DUBIN SPECIMEN MAMMOGRA (MISCELLANEOUS) ×4 IMPLANT
DRAPE CHEST BREAST 15X10 FENES (DRAPES) ×4 IMPLANT
DRSG PAD ABDOMINAL 8X10 ST (GAUZE/BANDAGES/DRESSINGS) ×4 IMPLANT
DRSG TEGADERM 2-3/8X2-3/4 SM (GAUZE/BANDAGES/DRESSINGS) ×4 IMPLANT
DRSG TEGADERM 4X4.75 (GAUZE/BANDAGES/DRESSINGS) ×4 IMPLANT
ELECT CAUTERY BLADE 6.4 (BLADE) ×4 IMPLANT
ELECT REM PT RETURN 9FT ADLT (ELECTROSURGICAL) ×4
ELECTRODE REM PT RTRN 9FT ADLT (ELECTROSURGICAL) ×2 IMPLANT
GAUZE SPONGE 2X2 8PLY STRL LF (GAUZE/BANDAGES/DRESSINGS) ×2 IMPLANT
GAUZE SPONGE 4X4 12PLY STRL (GAUZE/BANDAGES/DRESSINGS) ×4 IMPLANT
GLOVE SURG ENC MOIS LTX SZ7 (GLOVE) ×4 IMPLANT
GLOVE SURG UNDER POLY LF SZ7.5 (GLOVE) ×4 IMPLANT
GOWN STRL REUS W/ TWL LRG LVL3 (GOWN DISPOSABLE) ×4 IMPLANT
GOWN STRL REUS W/TWL LRG LVL3 (GOWN DISPOSABLE) ×4
ILLUMINATOR WAVEGUIDE N/F (MISCELLANEOUS) IMPLANT
KIT BASIN OR (CUSTOM PROCEDURE TRAY) ×4 IMPLANT
KIT MARKER MARGIN INK (KITS) ×4 IMPLANT
LIGHT WAVEGUIDE WIDE FLAT (MISCELLANEOUS) IMPLANT
NEEDLE HYPO 25GX1X1/2 BEV (NEEDLE) ×4 IMPLANT
NS IRRIG 1000ML POUR BTL (IV SOLUTION) ×4 IMPLANT
PACK GENERAL/GYN (CUSTOM PROCEDURE TRAY) ×4 IMPLANT
SPONGE GAUZE 2X2 8PLY STER LF (GAUZE/BANDAGES/DRESSINGS) ×2
SPONGE GAUZE 2X2 8PLY STRL LF (GAUZE/BANDAGES/DRESSINGS) ×6 IMPLANT
SPONGE GAUZE 2X2 STER 10/PKG (GAUZE/BANDAGES/DRESSINGS) ×2
SPONGE T-LAP 4X18 ~~LOC~~+RFID (SPONGE) ×4 IMPLANT
STRIP CLOSURE SKIN 1/2X4 (GAUZE/BANDAGES/DRESSINGS) ×6 IMPLANT
SUT MNCRL AB 4-0 PS2 18 (SUTURE) ×4 IMPLANT
SUT SILK 2 0 SH (SUTURE) IMPLANT
SUT VIC AB 3-0 SH 27 (SUTURE) ×2
SUT VIC AB 3-0 SH 27X BRD (SUTURE) ×2 IMPLANT
SYR CONTROL 10ML LL (SYRINGE) ×4 IMPLANT
TOWEL GREEN STERILE (TOWEL DISPOSABLE) ×4 IMPLANT
TOWEL GREEN STERILE FF (TOWEL DISPOSABLE) ×4 IMPLANT

## 2020-11-29 NOTE — Op Note (Addendum)
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 Surgeon:  [name]. 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  The patient has a long history of complicated mammograms requiring previous biopsy and lumpectomy about 20 years ago in her right breast.  For the last 10 years she has had diagnostic mammograms but no other biopsies.  Most recently, she had a complicated diagnostic mammogram that showed cysts scattered through both breasts with 2 possible masses in the right breast at 9:00 located 5 cm from the nipple oriented in an anterior posterior fashion.  The posterior mass measures 18 x 10 x 5 mm in the anterior mass measures 5 x 5 x 3 mm.  The more posterior mass revealed a complex sclerosing lesion with intraductal papilloma.  The anterior mass showed an intraductal papilloma.  MRI showed a third mass that was biopsied and showed intraductal papilloma. Three radioactive seeds were placed in the three masses, but they are all oriented in an anterior-posterior orientation.  She also has an enlarging skin lesion on her left upper extremity that she would like to have removed.  This measures about 1 cm in diameter.  Description of procedure: The patient is brought to the operating room placed in supine position on the operating room table. After an adequate level of general anesthesia was obtained, her right breast was prepped with ChloraPrep and draped in sterile fashion. A timeout was taken to ensure the proper patient and proper procedure. We interrogated the breast with the neoprobe. We made a transverse incision in the lower outer quadrant of the right breast after  infiltrating with 0.25% Marcaine. Dissection was carried down in the breast tissue with cautery. We used the neoprobe to guide [place] towards the radioactive seeds. We excised an area of tissue around the radioactive seed 2 cm but extending down to the chest wall. The specimen was removed and was oriented with a paint kit. Specimen mammogram showed the three radioactive seeds as well as the three biopsy clips within the specimen. This was sent for pathologic examination. There is no residual radioactivity within the biopsy cavity. We inspected carefully for hemostasis. The wound was thoroughly irrigated. The wound was closed with a deep layer of 3-0 Vicryl and a subcuticular layer of 4-0 Monocryl. Benzoin Steri-Strips were applied.   We then turned our attention to the left upper extremity.  We prepped the area over the skin lesion with Chloraprep and draped in sterile fashion.  We anesthetized the area with local anesthetic, then made an elliptical incision around the 1 cm lesion.  I excised the full thickness skin biopsy and sent this to pathology.  Hemostasis was obtained with cautery.  We closed the skin with subcuticular 4-0 Monocryl.  Benzoin and steri-strips were applied.  The patient was then extubated and brought to the recovery room in stable condition. All sponge, instrument, and needle counts are correct.  Lauren Villarreal. Lauren Dover, MD, [facility]  General/ Trauma Surgery  11/29/2020 9:43 AM

## 2020-11-29 NOTE — Transfer of Care (Signed)
Immediate Anesthesia Transfer of Care Note  Patient: Loss adjuster, chartered  Procedure(s) Performed: RIGHT BREAST LUMPECTOMY WITH RADIOACTIVE SEED LOCALIZATION X3 (Right: Breast) EXCISION OF LEFT UPPER EXTREMITY SKIN LESION (Left: Arm Upper)  Patient Location: PACU  Anesthesia Type:General  Level of Consciousness: awake, oriented, drowsy and patient cooperative  Airway & Oxygen Therapy: Patient Spontanous Breathing and Patient connected to nasal cannula oxygen  Post-op Assessment: Report given to RN, Post -op Vital signs reviewed and stable and Patient moving all extremities X 4  Post vital signs: Reviewed and stable  Last Vitals:  Vitals Value Taken Time  BP 105/67 11/29/20 0948  Temp    Pulse 77 11/29/20 0950  Resp 17 11/29/20 0950  SpO2 95 % 11/29/20 0950  Vitals shown include unvalidated device data.  Last Pain:  Vitals:   11/29/20 0701  TempSrc:   PainSc: 0-No pain         Complications: No notable events documented.

## 2020-11-29 NOTE — Discharge Instructions (Signed)
[facility] Office Phone Number [phone]  BREAST BIOPSY/ PARTIAL MASTECTOMY: POST OP INSTRUCTIONS  Always review your discharge instruction sheet given to you by the facility where your surgery was performed.  IF YOU HAVE DISABILITY OR FAMILY LEAVE FORMS, YOU MUST BRING THEM TO THE OFFICE FOR PROCESSING.  DO NOT GIVE THEM TO YOUR DOCTOR.  A prescription for pain medication may be given to you upon discharge.  Take your pain medication as prescribed, if needed.  If narcotic pain medicine is not needed, then you may take acetaminophen (Tylenol) or ibuprofen (Advil) as needed. Take your usually prescribed medications unless otherwise directed If you need a refill on your pain medication, please contact your pharmacy.  They will contact our office to request authorization.  Prescriptions will not be filled after 5pm or on week-ends. You should eat very light the first 24 hours after surgery, such as soup, crackers, pudding, etc.  Resume your normal diet the day after surgery. Most patients will experience some swelling and bruising in the breast.  Ice packs and a good support bra will help.  Swelling and bruising can take several days to resolve.  It is common to experience some constipation if taking pain medication after surgery.  Increasing fluid intake and taking a stool softener will usually help or prevent this problem from occurring.  A mild laxative (Milk of Magnesia or Miralax) should be taken according to package directions if there are no bowel movements after 48 hours. Unless discharge instructions indicate otherwise, you may remove your bandages 24-48 hours after surgery, and you may shower at that time.  You may have steri-strips (small skin tapes) in place directly over the incision.  These strips should be left on the skin for 7-10 days.  If your surgeon used skin glue on the incision, you may shower in 24 hours.  The glue will flake off over the next 2-3 weeks.  Any  sutures or staples will be removed at the office during your follow-up visit. ACTIVITIES:  You may resume regular daily activities (gradually increasing) beginning the next day.  Wearing a good support bra or sports bra minimizes pain and swelling.  You may have sexual intercourse when it is comfortable. You may drive when you no longer are taking prescription pain medication, you can comfortably wear a seatbelt, and you can safely maneuver your car and apply brakes. RETURN TO WORK:  ______________________________________________________________________________________ You should see your doctor in the office for a follow-up appointment approximately two weeks after your surgery.  Your doctor's nurse will typically make your follow-up appointment when she calls you with your pathology report.  Expect your pathology report 2-3 business days after your surgery.  You may call to check if you do not hear from us after three days. OTHER INSTRUCTIONS: _______________________________________________________________________________________________ _____________________________________________________________________________________________________________________________________ _____________________________________________________________________________________________________________________________________ _____________________________________________________________________________________________________________________________________  WHEN TO CALL YOUR DOCTOR: Fever over 101.0 Nausea and/or vomiting. Extreme swelling or bruising. Continued bleeding from incision. Increased pain, redness, or drainage from the incision.  The clinic staff is available to answer your questions during regular business hours.  Please don't hesitate to call and ask to speak to one of the nurses for clinical concerns.  If you have a medical emergency, go to the nearest emergency room or call 911.  A surgeon from [facility] is always on call at the hospital.  For further questions, please visit [email or web address]   

## 2020-11-29 NOTE — Anesthesia Procedure Notes (Signed)
Procedure Name: LMA Insertion Date/Time: 11/29/2020 8:43 AM Performed by: Claris Che, CRNA Pre-anesthesia Checklist: Patient identified, Emergency Drugs available, Suction available, Patient being monitored and Timeout performed Patient Re-evaluated:Patient Re-evaluated prior to induction Oxygen Delivery Method: Circle system utilized Preoxygenation: Pre-oxygenation with 100% oxygen Induction Type: IV induction Ventilation: Mask ventilation without difficulty LMA Size: 4.0 Number of attempts: 1 Placement Confirmation: positive ETCO2 and breath sounds checked- equal and bilateral Tube secured with: Tape Dental Injury: Teeth and Oropharynx as per pre-operative assessment

## 2020-11-29 NOTE — Anesthesia Preprocedure Evaluation (Signed)
Anesthesia Evaluation  Patient identified by MRN, date of birth, ID band Patient awake    Reviewed: Allergy & Precautions, NPO status , Patient's Chart, lab work & pertinent test results  History of Anesthesia Complications Negative for: history of anesthetic complications  Airway Mallampati: II  TM Distance: >3 FB Neck ROM: Full    Dental  (+) Dental Advisory Given, Teeth Intact, Missing,    Pulmonary neg pulmonary ROS,    breath sounds clear to auscultation       Cardiovascular negative cardio ROS   Rhythm:Regular     Neuro/Psych neg Seizures negative psych ROS   GI/Hepatic negative GI ROS, Neg liver ROS,   Endo/Other  negative endocrine ROS  Renal/GU negative Renal ROS     Musculoskeletal  (+) Arthritis ,   Abdominal   Peds  Hematology negative hematology ROS (+) Lab Results      Component                Value               Date                      WBC                      6.5                 11/24/2020                HGB                      13.1                11/24/2020                HCT                      40.1                11/24/2020                MCV                      85.5                11/24/2020                PLT                      287                 11/24/2020              Anesthesia Other Findings   Reproductive/Obstetrics                             Anesthesia Physical Anesthesia Plan  ASA: 2  Anesthesia Plan: General   Post-op Pain Management:    Induction: Intravenous  PONV Risk Score and Plan: 3 and Ondansetron, Dexamethasone, Propofol infusion and TIVA  Airway Management Planned: LMA  Additional Equipment: None  Intra-op Plan:   Post-operative Plan: Extubation in OR  Informed Consent: I have reviewed the patients History and Physical, chart, labs and discussed the procedure including the risks, benefits and alternatives for the proposed  anesthesia with the patient or authorized representative who  has indicated his/her understanding and acceptance.     Dental advisory given  Plan Discussed with: CRNA and Anesthesiologist  Anesthesia Plan Comments:         Anesthesia Quick Evaluation

## 2020-11-29 NOTE — Interval H&P Note (Signed)
History and Physical Interval Note:  11/29/2020 7:27 AM  Lauren Villarreal  has presented today for surgery, with the diagnosis of RIGHT BREAST COMPLEX SCLEROSING LESION AND INTRADUCTAL PAPILLOMA.  The various methods of treatment have been discussed with the patient and family. After consideration of risks, benefits and other options for treatment, the patient has consented to  Procedure(s): RIGHT BREAST LUMPECTOMY WITH RADIOACTIVE SEED LOCALIZATION X2 (Right) EXCISION OF LEFT UPPER EXTREMITY SKIN LESION (Left) as a surgical intervention.  The patient's history has been reviewed, patient examined, no change in status, stable for surgery.  I have reviewed the patient's chart and labs.  Questions were answered to the patient's satisfaction.     [name]

## 2020-11-30 ENCOUNTER — Encounter (HOSPITAL_COMMUNITY): Payer: Self-pay | Admitting: Surgery

## 2020-11-30 LAB — SURGICAL PATHOLOGY

## 2020-11-30 NOTE — Anesthesia Postprocedure Evaluation (Signed)
Anesthesia Post Note  Patient: Loss adjuster, chartered  Procedure(s) Performed: RIGHT BREAST LUMPECTOMY WITH RADIOACTIVE SEED LOCALIZATION X3 (Right: Breast) EXCISION OF LEFT UPPER EXTREMITY SKIN LESION (Left: Arm Upper)     Patient location during evaluation: PACU Anesthesia Type: General Level of consciousness: awake and alert Pain management: pain level controlled Vital Signs Assessment: post-procedure vital signs reviewed and stable Respiratory status: spontaneous breathing, nonlabored ventilation, respiratory function stable and patient connected to nasal cannula oxygen Cardiovascular status: blood pressure returned to baseline and stable Postop Assessment: no apparent nausea or vomiting Anesthetic complications: no   No notable events documented.  Last Vitals:  Vitals:   11/29/20 1005 11/29/20 1017  BP: 121/80 119/68  Pulse: 67 60  Resp: 17 15  Temp:  36.8 C  SpO2: 95% 96%    Last Pain:  Vitals:   11/29/20 1017  TempSrc:   PainSc: 3                  Alayssa Flinchum

## 2021-03-13 ENCOUNTER — Encounter (HOSPITAL_COMMUNITY): Payer: Self-pay

## 2021-11-22 ENCOUNTER — Other Ambulatory Visit: Payer: Self-pay | Admitting: Family Medicine

## 2021-11-22 DIAGNOSIS — Z1231 Encounter for screening mammogram for malignant neoplasm of breast: Secondary | ICD-10-CM

## 2022-01-24 ENCOUNTER — Ambulatory Visit
Admission: RE | Admit: 2022-01-24 | Discharge: 2022-01-24 | Disposition: A | Discharge status: Home / Self Care | Payer: BC Managed Care – PPO | Source: Ambulatory Visit | Attending: Family Medicine | Admitting: Family Medicine

## 2022-01-24 DIAGNOSIS — Z1231 Encounter for screening mammogram for malignant neoplasm of breast: Secondary | ICD-10-CM

## 2022-04-18 ENCOUNTER — Other Ambulatory Visit: Payer: Self-pay | Admitting: Family Medicine

## 2022-04-18 DIAGNOSIS — R748 Abnormal levels of other serum enzymes: Secondary | ICD-10-CM

## 2022-05-07 ENCOUNTER — Other Ambulatory Visit: Payer: BC Managed Care – PPO

## 2022-05-12 IMAGING — MG MM DIGITAL DIAGNOSTIC UNILAT*R* W/ TOMO W/ CAD
4 series · 4 of 12 positions shown · non-contrast
Comparison: Previous exam(s).

CLINICAL DATA: The patient was called back for a possible right
breast mass.

EXAM:
DIGITAL DIAGNOSTIC UNILATERAL RIGHT MAMMOGRAM WITH TOMOSYNTHESIS AND
CAD; ULTRASOUND RIGHT BREAST LIMITED
TECHNIQUE: Right digital diagnostic mammography and breast tomosynthesis was
performed. The images were evaluated with computer-aided detection.;
Targeted ultrasound examination of the right breast was performed

[R CC synth-2D]
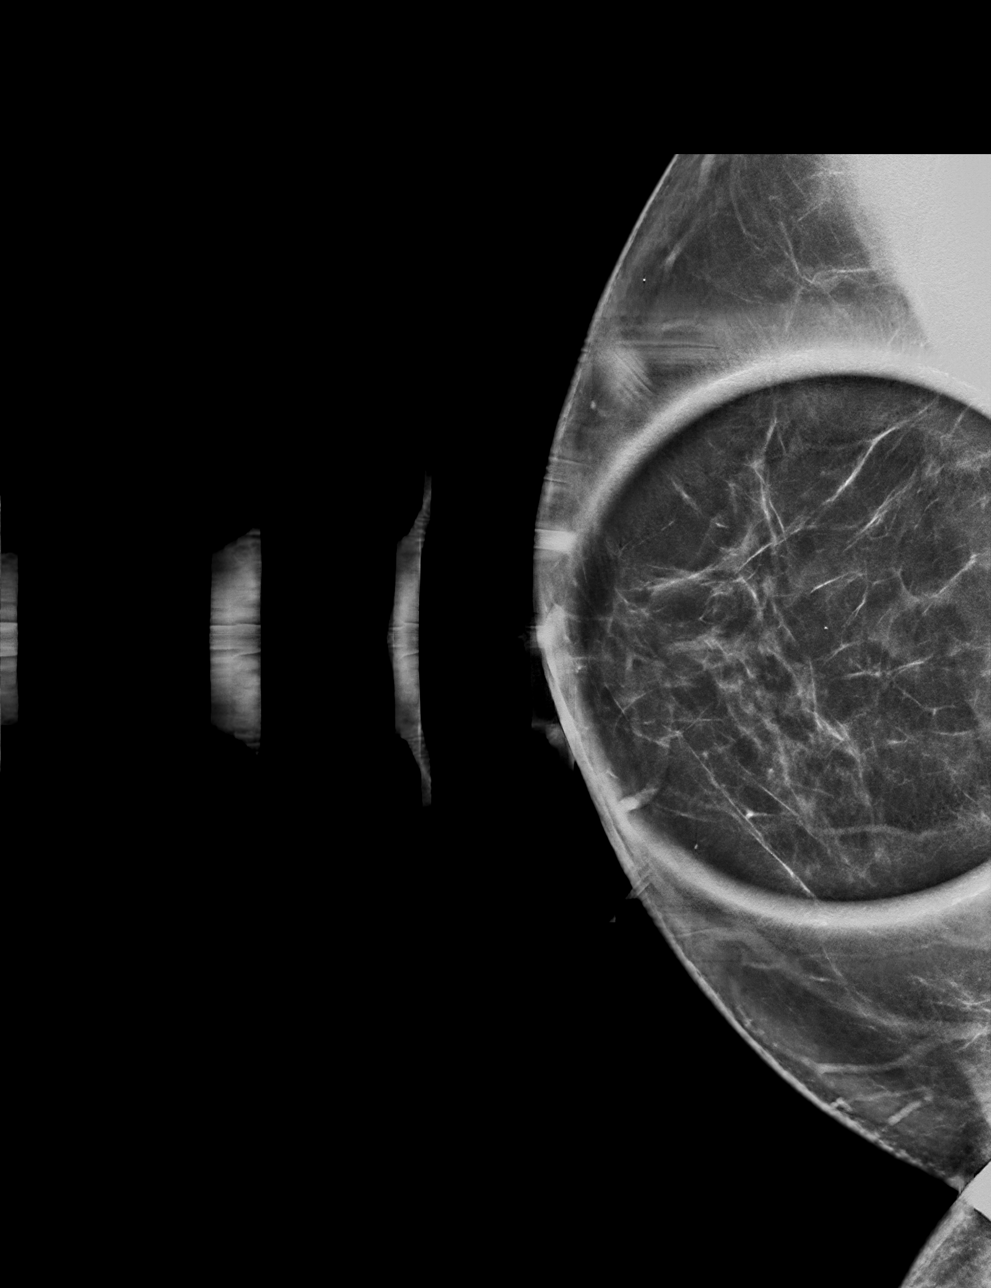

[R MLO synth-2D]
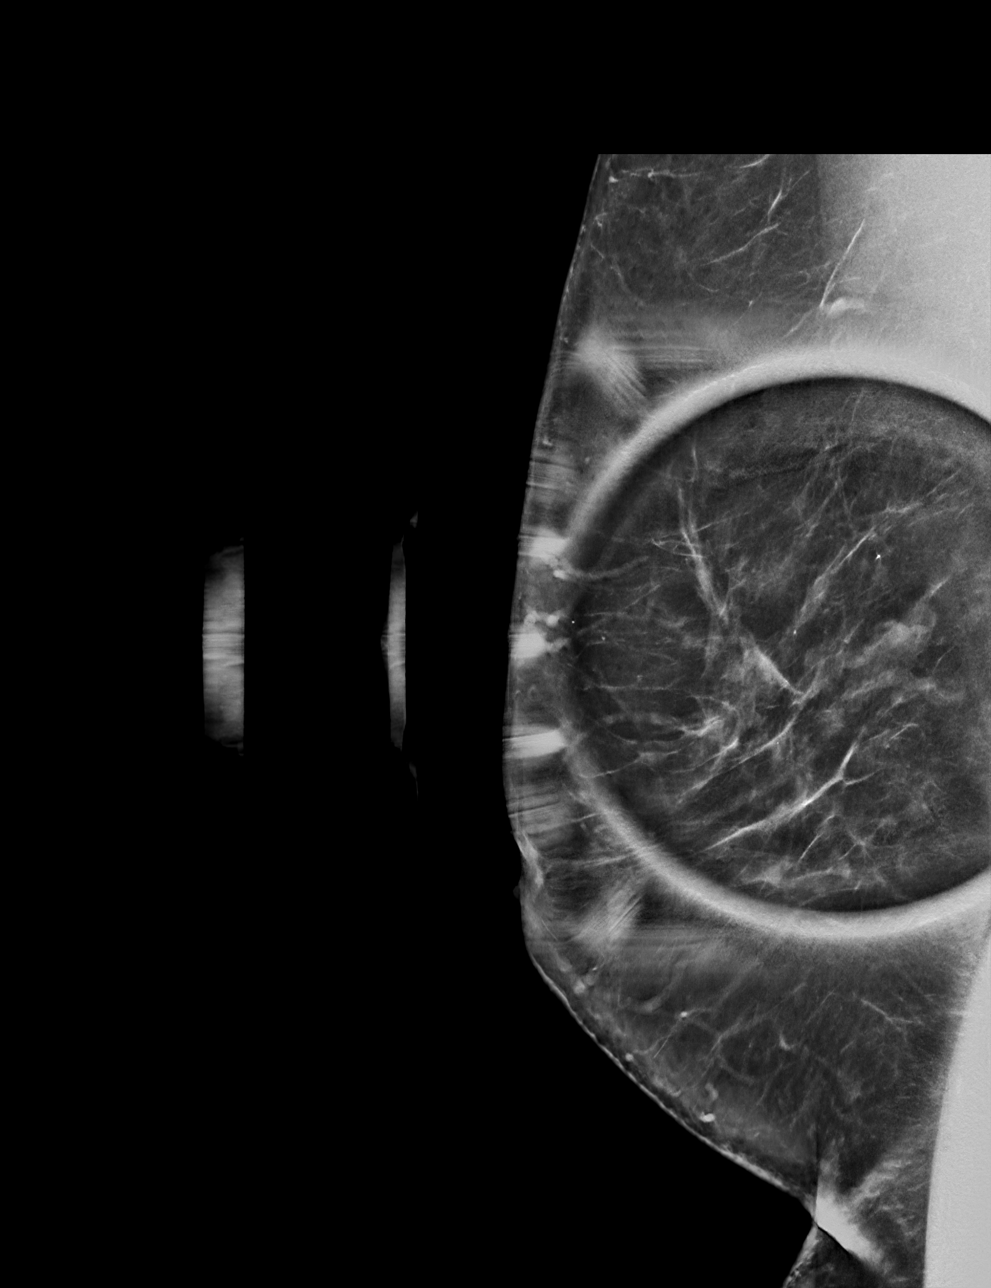

[R CC tomo · tomo slice 43/84.0]
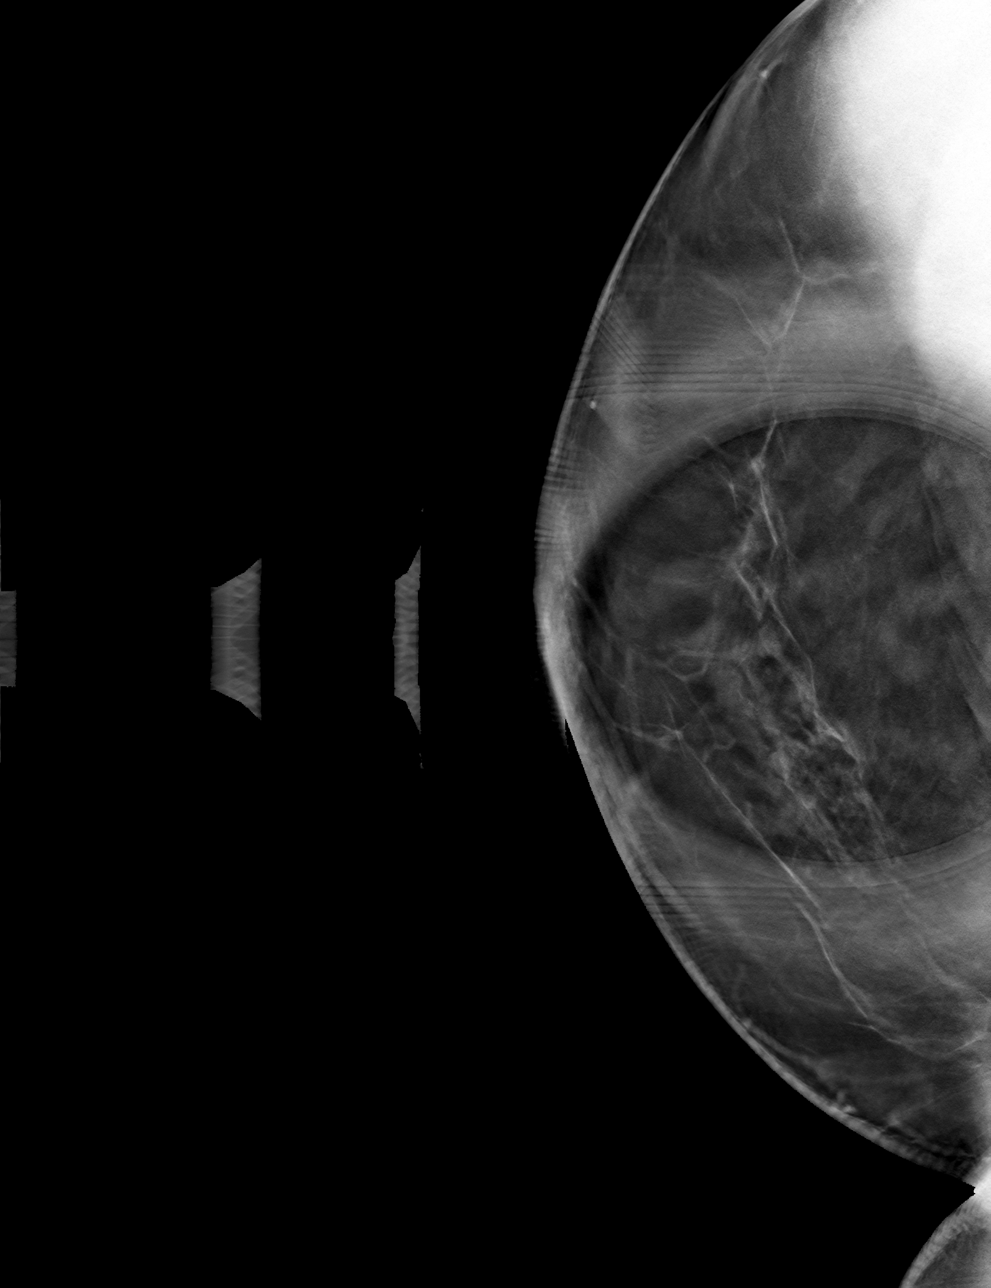

[R MLO tomo · tomo slice 47/93.0]
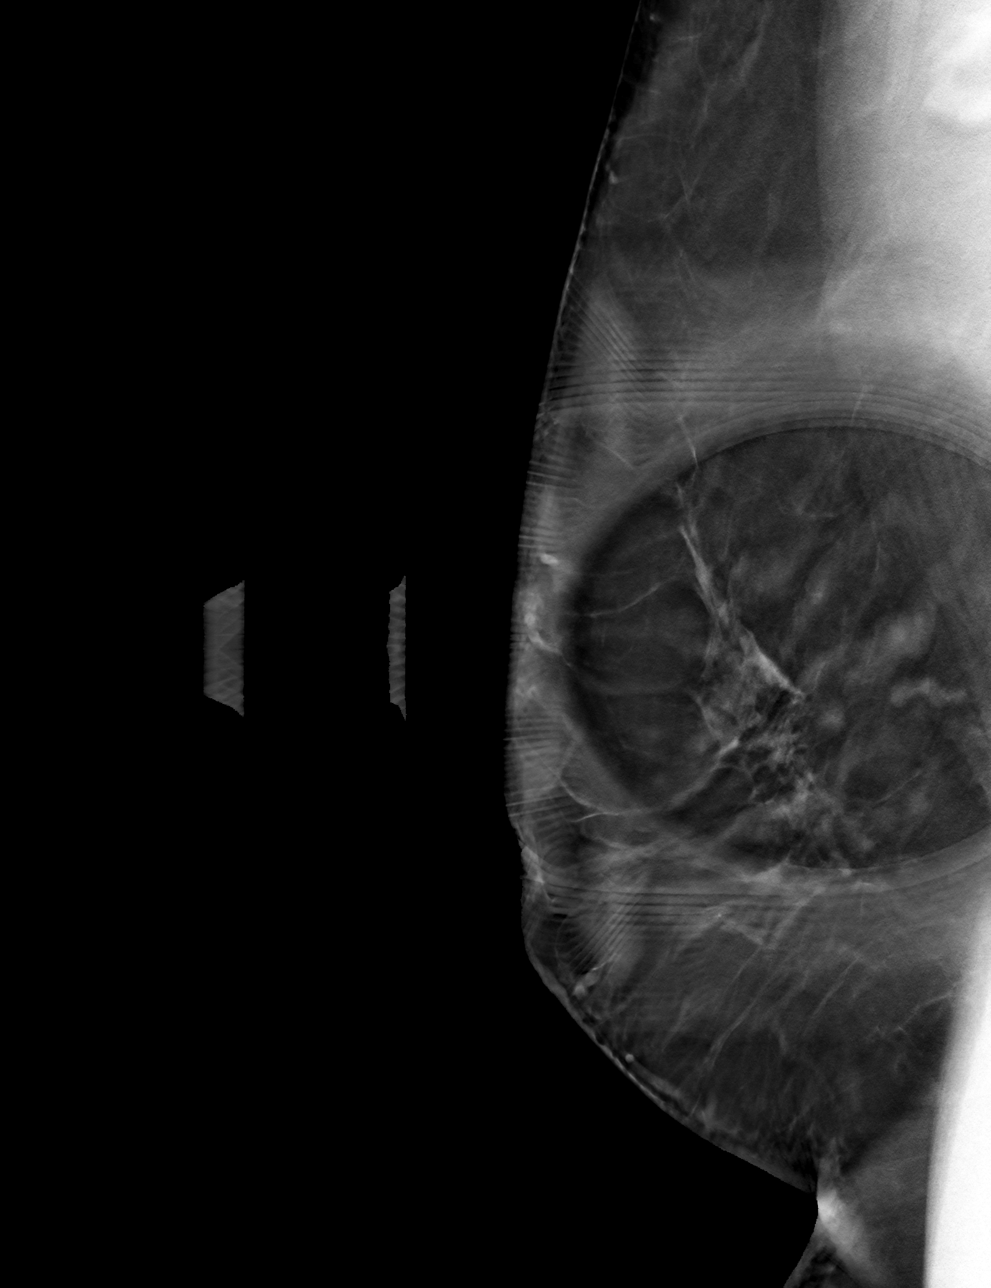

[4 of 12 positions shown; findings below may reference images not displayed]

ACR Breast Density Category b: There are scattered areas of
fibroglandular density.
FINDINGS: The possible mass on the right cc view resolves on additional
imaging. The mass in the posterior right breast on the MLO view
persists on additional imaging. There appear to be multiple masses
in the right breast mammographically on the MLO view.

On physical exam, no suspicious lumps are identified.

Targeted ultrasound is performed, showing multiple simple and
complicated cysts. The mass seen mammographically is noted at 9
o'clock, 5 cm from the nipple measuring 18 by 10 x 5 mm. While this
mass demonstrates anechoic components, there are also hypoechoic
components. This mass is a complex cystic mass. Anterior to this
mass is another at 9 o'clock, 5 cm from the nipple measuring 5 by 5
x 3 mm. This could be a debris-filled cyst versus a solid mass.
Multiple similar appearing masses are seen throughout the adjacent
right breast. No axillary adenopathy.
IMPRESSION: Multiple masses are seen in the lateral right breast. The
mammographically identified mass has cystic components and is likely
a complicated cyst. Solid components are not excluded. Anterior to
this mass is another mass which could represent a debris-filled cyst
versus a solid mass. Numerous other similar appearing masses are
seen in this region.

RECOMMENDATION:
Recommend ultrasound-guided biopsy of the 2 masses at 9 o'clock, 5
cm from the nipple. If these biopsies are benign, recommend
six-month follow-up mammography and ultrasound to ensure stability
of the other surrounding masses. If either biopsy results in the
need for surgery, recommend breast MRI.

I have discussed the findings and recommendations with the patient.
If applicable, a reminder letter will be sent to the patient
regarding the next appointment.

BI-RADS CATEGORY  4: Suspicious.

## 2022-05-12 IMAGING — US US BREAST*R* LIMITED INC AXILLA
1 series · 12 of 12 positions shown · non-contrast
Comparison: Previous exam(s).

CLINICAL DATA: The patient was called back for a possible right
breast mass.

EXAM:
DIGITAL DIAGNOSTIC UNILATERAL RIGHT MAMMOGRAM WITH TOMOSYNTHESIS AND
CAD; ULTRASOUND RIGHT BREAST LIMITED
TECHNIQUE: Right digital diagnostic mammography and breast tomosynthesis was
performed. The images were evaluated with computer-aided detection.;
Targeted ultrasound examination of the right breast was performed

[Series 1: us breast*right* limited inc axilla · 0.06mm/px · 12 of 12 slices shown]
[im 1/12]
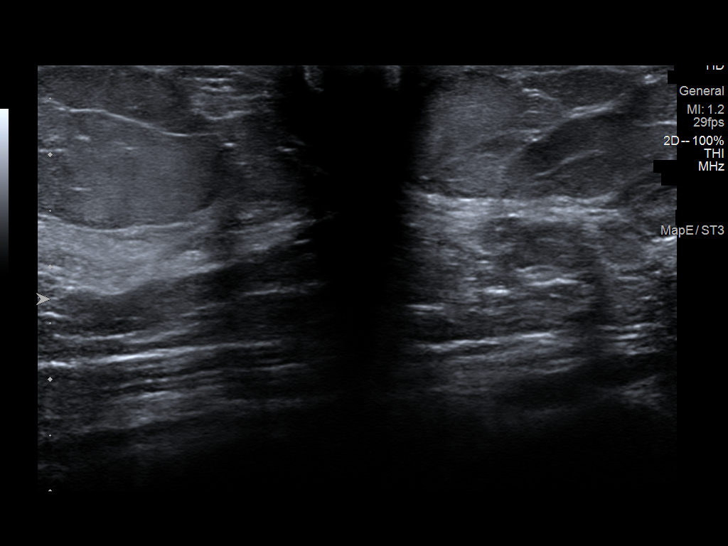
[im 2/12]
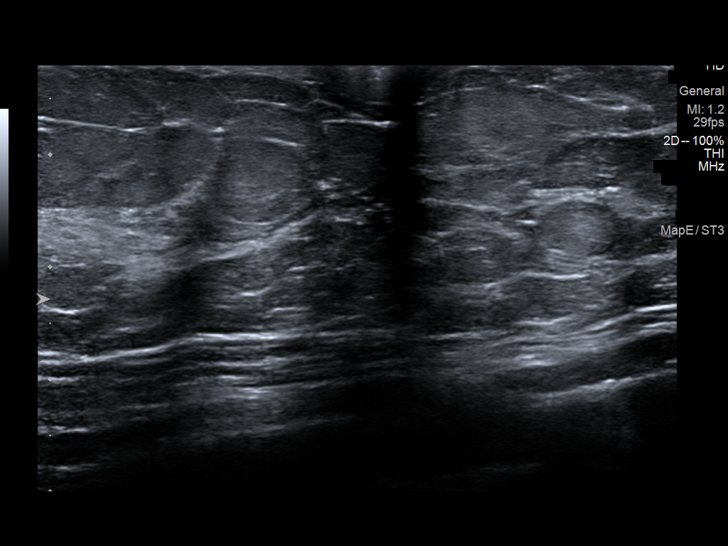
[im 3/12]
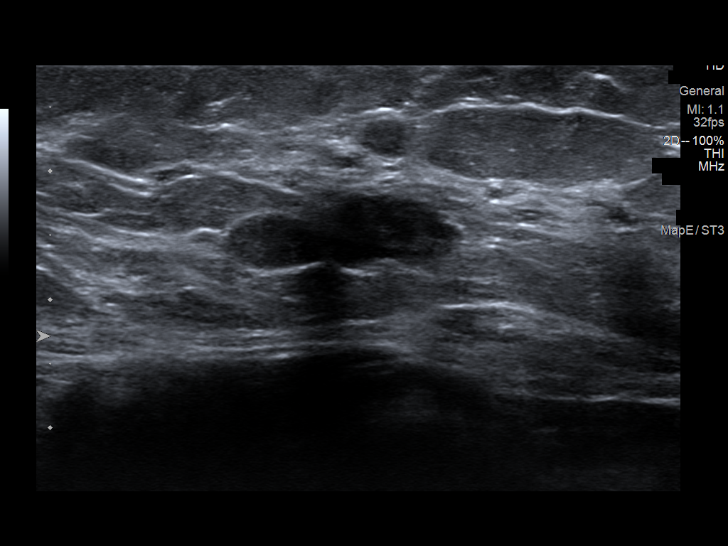
[im 4/12]
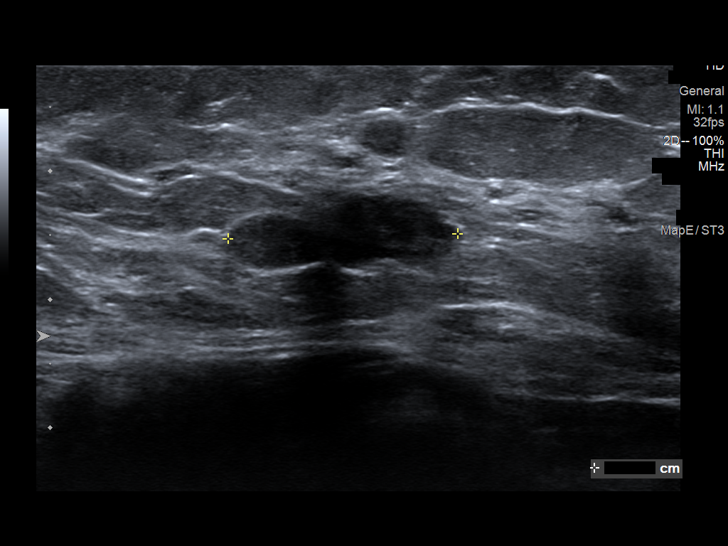
[im 5/12]
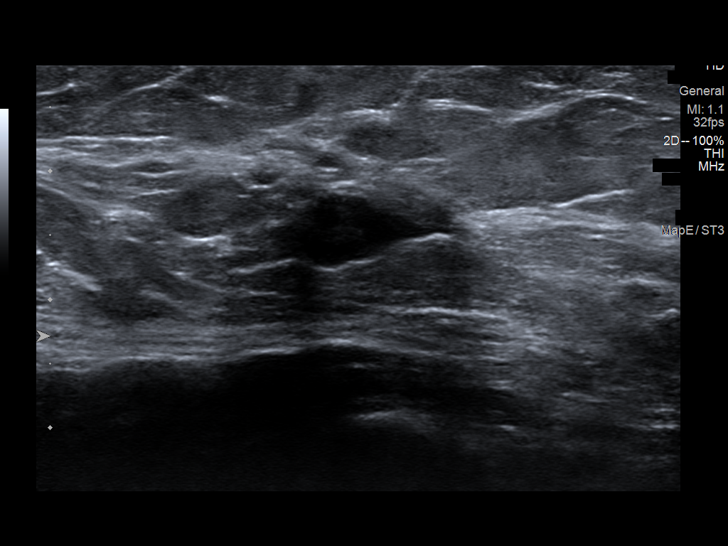
[im 6/12]
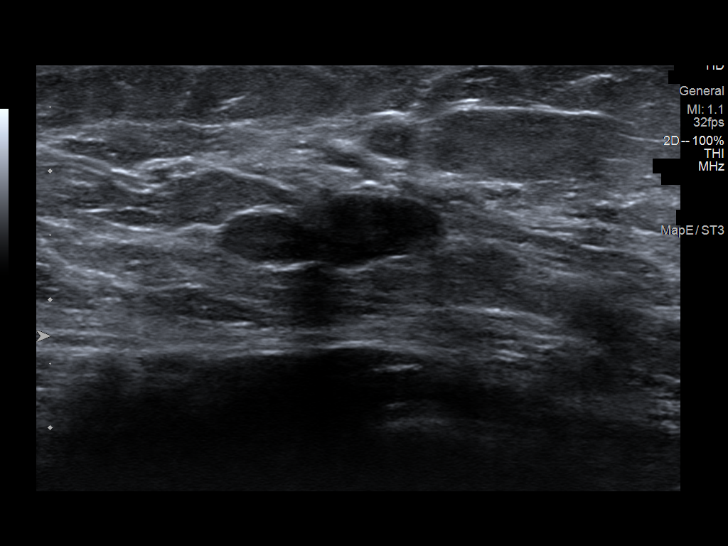
[im 7/12]
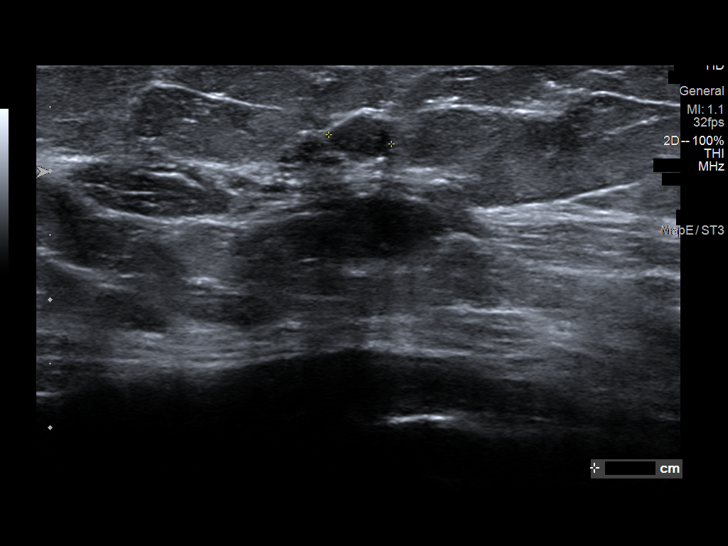
[im 8/12]
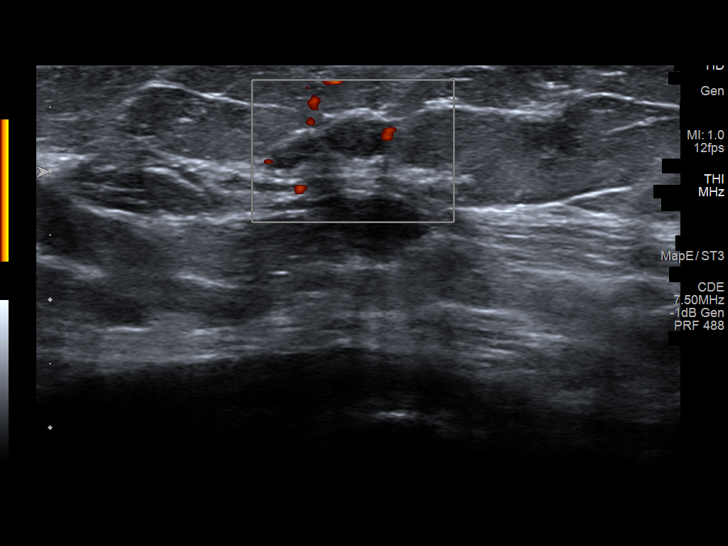
[im 9/12]
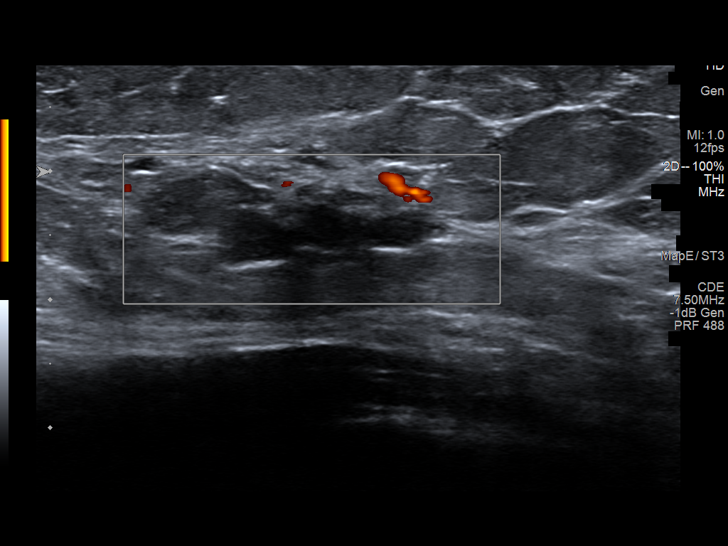
[im 10/12]
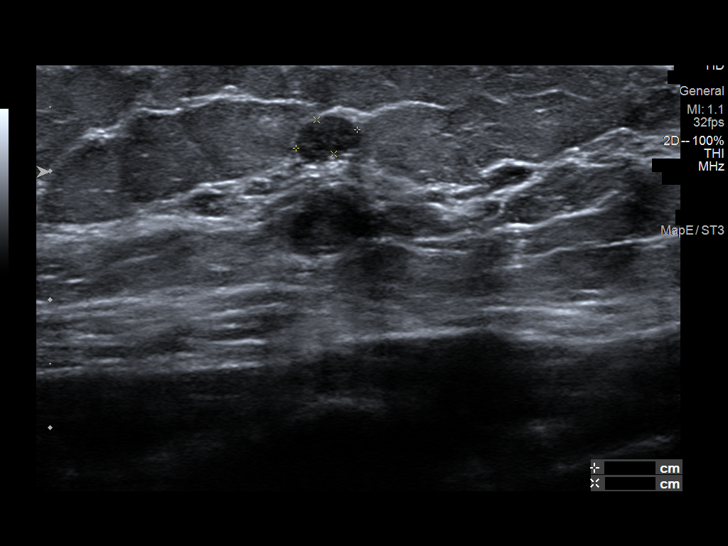
[im 11/12]
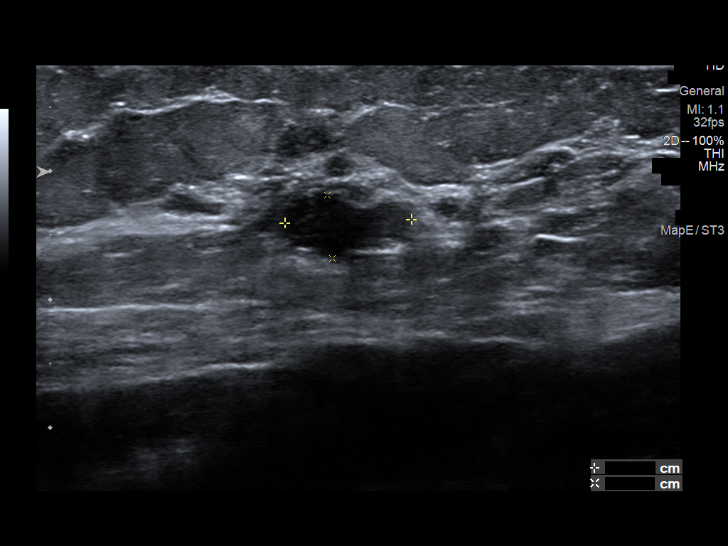
[im 12/12]
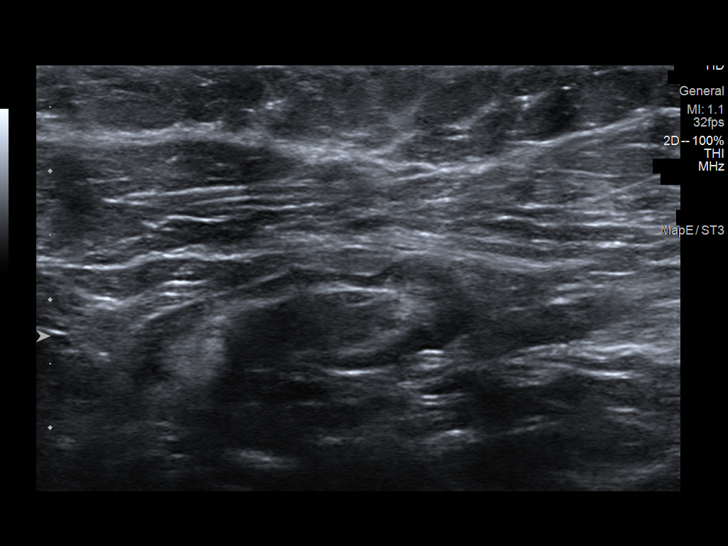

[12 of 12 positions shown; findings below may reference images not displayed]

ACR Breast Density Category b: There are scattered areas of
fibroglandular density.
FINDINGS: The possible mass on the right cc view resolves on additional
imaging. The mass in the posterior right breast on the MLO view
persists on additional imaging. There appear to be multiple masses
in the right breast mammographically on the MLO view.

On physical exam, no suspicious lumps are identified.

Targeted ultrasound is performed, showing multiple simple and
complicated cysts. The mass seen mammographically is noted at 9
o'clock, 5 cm from the nipple measuring 18 by 10 x 5 mm. While this
mass demonstrates anechoic components, there are also hypoechoic
components. This mass is a complex cystic mass. Anterior to this
mass is another at 9 o'clock, 5 cm from the nipple measuring 5 by 5
x 3 mm. This could be a debris-filled cyst versus a solid mass.
Multiple similar appearing masses are seen throughout the adjacent
right breast. No axillary adenopathy.
IMPRESSION: Multiple masses are seen in the lateral right breast. The
mammographically identified mass has cystic components and is likely
a complicated cyst. Solid components are not excluded. Anterior to
this mass is another mass which could represent a debris-filled cyst
versus a solid mass. Numerous other similar appearing masses are
seen in this region.

RECOMMENDATION:
Recommend ultrasound-guided biopsy of the 2 masses at 9 o'clock, 5
cm from the nipple. If these biopsies are benign, recommend
six-month follow-up mammography and ultrasound to ensure stability
of the other surrounding masses. If either biopsy results in the
need for surgery, recommend breast MRI.

I have discussed the findings and recommendations with the patient.
If applicable, a reminder letter will be sent to the patient
regarding the next appointment.

BI-RADS CATEGORY  4: Suspicious.

## 2022-05-14 ENCOUNTER — Ambulatory Visit
Admission: RE | Admit: 2022-05-14 | Discharge: 2022-05-14 | Disposition: A | Discharge status: Home / Self Care | Payer: BC Managed Care – PPO | Source: Ambulatory Visit | Attending: Family Medicine | Admitting: Family Medicine

## 2022-05-14 DIAGNOSIS — R748 Abnormal levels of other serum enzymes: Secondary | ICD-10-CM

## 2023-07-28 ENCOUNTER — Other Ambulatory Visit: Payer: Self-pay | Admitting: Family Medicine

## 2023-07-28 DIAGNOSIS — Z1231 Encounter for screening mammogram for malignant neoplasm of breast: Secondary | ICD-10-CM

## 2023-08-15 ENCOUNTER — Ambulatory Visit
Admission: RE | Admit: 2023-08-15 | Discharge: 2023-08-15 | Disposition: A | Discharge status: Home / Self Care | Source: Ambulatory Visit | Attending: Family Medicine | Admitting: Family Medicine

## 2023-08-15 DIAGNOSIS — Z1231 Encounter for screening mammogram for malignant neoplasm of breast: Secondary | ICD-10-CM

## 2023-08-20 ENCOUNTER — Other Ambulatory Visit: Payer: Self-pay | Admitting: Family Medicine

## 2023-08-20 DIAGNOSIS — R928 Other abnormal and inconclusive findings on diagnostic imaging of breast: Secondary | ICD-10-CM

## 2023-08-25 ENCOUNTER — Ambulatory Visit
Admission: RE | Admit: 2023-08-25 | Discharge: 2023-08-25 | Disposition: A | Discharge status: Home / Self Care | Source: Ambulatory Visit | Attending: Family Medicine | Admitting: Family Medicine

## 2023-08-25 DIAGNOSIS — R928 Other abnormal and inconclusive findings on diagnostic imaging of breast: Secondary | ICD-10-CM
